# Patient Record
Sex: Male | Born: 1995 | Race: White | Hispanic: No | Marital: Single | Smoking: Current every day smoker
Health system: Southern US, Community
[De-identification: ages and names within clinical notes are randomized; demographics above are authoritative.]

## PROBLEM LIST (undated history)

## (undated) DIAGNOSIS — K5790 Diverticulosis of intestine, part unspecified, without perforation or abscess without bleeding: Secondary | ICD-10-CM

## (undated) HISTORY — PX: APPENDECTOMY: SHX54

---
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
## 2013-01-05 ENCOUNTER — Emergency Department (HOSPITAL_COMMUNITY): Payer: BC Managed Care – PPO | Admitting: Anesthesiology

## 2013-01-05 ENCOUNTER — Encounter (HOSPITAL_COMMUNITY): Admission: EM | Disposition: A | Discharge status: Home / Self Care | Payer: Self-pay | Source: Home / Self Care

## 2013-01-05 ENCOUNTER — Encounter (HOSPITAL_COMMUNITY): Payer: Self-pay

## 2013-01-05 ENCOUNTER — Emergency Department (HOSPITAL_COMMUNITY): Payer: BC Managed Care – PPO

## 2013-01-05 ENCOUNTER — Observation Stay (HOSPITAL_COMMUNITY)
Admission: EM | Admit: 2013-01-05 | Discharge: 2013-01-06 | Disposition: A | Discharge status: Home / Self Care | Payer: BC Managed Care – PPO | Attending: General Surgery | Admitting: General Surgery

## 2013-01-05 ENCOUNTER — Encounter (HOSPITAL_COMMUNITY): Payer: Self-pay | Admitting: Anesthesiology

## 2013-01-05 DIAGNOSIS — Z01812 Encounter for preprocedural laboratory examination: Secondary | ICD-10-CM | POA: Insufficient documentation

## 2013-01-05 DIAGNOSIS — K3580 Unspecified acute appendicitis: Principal | ICD-10-CM | POA: Insufficient documentation

## 2013-01-05 HISTORY — PX: LAPAROSCOPIC APPENDECTOMY: SHX408

## 2013-01-05 LAB — BASIC METABOLIC PANEL
BUN: 14 mg/dL (ref 6–23)
Chloride: 104 mEq/L (ref 96–112)
GFR calc Af Amer: >90 mL/min (ref >90)
Glucose, Bld: 94 mg/dL (ref 70–99)
Potassium: 3.8 mEq/L (ref 3.5–5.1)

## 2013-01-05 LAB — URINALYSIS, ROUTINE W REFLEX MICROSCOPIC
Bilirubin Urine: NEGATIVE
Nitrite: NEGATIVE
Specific Gravity, Urine: 1.025 (ref 1.005–1.030)
pH: 6.5 (ref 5.0–8.0)

## 2013-01-05 LAB — CBC WITH DIFFERENTIAL/PLATELET
Basophils Relative: 0 % (ref 0–1)
Hemoglobin: 14.9 g/dL (ref 13.0–17.0)
Lymphs Abs: 2.7 10*3/uL (ref 0.7–4.0)
Monocytes Relative: 9 % (ref 3–12)
Neutro Abs: 6 10*3/uL (ref 1.7–7.7)
Neutrophils Relative %: 61 % (ref 43–77)
RBC: 5.25 MIL/uL (ref 4.22–5.81)

## 2013-01-05 SURGERY — APPENDECTOMY, LAPAROSCOPIC
Anesthesia: General | Site: Abdomen | Wound class: Contaminated

## 2013-01-05 MED ORDER — HYDROCODONE-ACETAMINOPHEN 5-325 MG PO TABS
ORAL_TABLET | ORAL | Status: AC
  Filled 2013-01-05: qty 1

## 2013-01-05 MED ORDER — IOHEXOL 300 MG/ML  SOLN
100.0000 mL | Freq: Once | INTRAMUSCULAR | Status: AC | PRN
  Administered 2013-01-05: 100 mL via INTRAVENOUS

## 2013-01-05 MED ORDER — FENTANYL CITRATE 0.05 MG/ML IJ SOLN
50.0000 ug | Freq: Once | INTRAMUSCULAR | Status: AC
  Administered 2013-01-05: 50 ug via INTRAVENOUS
  Filled 2013-01-05: qty 2

## 2013-01-05 MED ORDER — HYDROCODONE-ACETAMINOPHEN 5-325 MG PO TABS
1.0000 | ORAL_TABLET | Freq: Once | ORAL | Status: AC
  Administered 2013-01-05: 1 via ORAL

## 2013-01-05 MED ORDER — FENTANYL CITRATE 0.05 MG/ML IJ SOLN
50.0000 ug | Freq: Once | INTRAMUSCULAR | Status: AC
  Administered 2013-01-05: 50 ug via INTRAVENOUS

## 2013-01-05 MED ORDER — SODIUM CHLORIDE 0.9 % IV SOLN
INTRAVENOUS | Status: DC
  Administered 2013-01-05 (×2): via INTRAVENOUS

## 2013-01-05 MED ORDER — ROCURONIUM BROMIDE 100 MG/10ML IV SOLN
INTRAVENOUS | Status: DC | PRN
  Administered 2013-01-05: 25 mg via INTRAVENOUS

## 2013-01-05 MED ORDER — HYDROMORPHONE HCL PF 1 MG/ML IJ SOLN: 1.0000 mg | Freq: Once | INTRAMUSCULAR | Status: DC

## 2013-01-05 MED ORDER — DEXTROSE 5 % IV SOLN
2.0000 g | Freq: Two times a day (BID) | INTRAVENOUS | Status: DC
  Filled 2013-01-05 (×3): qty 2

## 2013-01-05 MED ORDER — SUCCINYLCHOLINE CHLORIDE 20 MG/ML IJ SOLN
INTRAMUSCULAR | Status: DC | PRN
  Administered 2013-01-05: 120 mg via INTRAVENOUS

## 2013-01-05 MED ORDER — ENOXAPARIN SODIUM 40 MG/0.4ML ~~LOC~~ SOLN: 40.0000 mg | Freq: Once | SUBCUTANEOUS | Status: DC

## 2013-01-05 MED ORDER — DIPHENHYDRAMINE HCL 50 MG/ML IJ SOLN
25.0000 mg | Freq: Once | INTRAMUSCULAR | Status: AC
  Administered 2013-01-05: 25 mg via INTRAVENOUS

## 2013-01-05 MED ORDER — LIDOCAINE HCL (CARDIAC) 20 MG/ML IV SOLN
INTRAVENOUS | Status: DC | PRN
  Administered 2013-01-05: 50 mg via INTRAVENOUS

## 2013-01-05 MED ORDER — HYDROCODONE-ACETAMINOPHEN 5-325 MG PO TABS
1.0000 | ORAL_TABLET | ORAL | Status: DC | PRN
  Administered 2013-01-05: 1 via ORAL
  Administered 2013-01-05: 2 via ORAL
  Administered 2013-01-06 (×2): 1 via ORAL
  Filled 2013-01-05 (×2): qty 1
  Filled 2013-01-05: qty 2

## 2013-01-05 MED ORDER — HYDROCODONE-ACETAMINOPHEN 5-325 MG PO TABS: 1.0000 | ORAL_TABLET | ORAL | Status: DC | PRN

## 2013-01-05 MED ORDER — MIDAZOLAM HCL 5 MG/5ML IJ SOLN
INTRAMUSCULAR | Status: DC | PRN
  Administered 2013-01-05: 2 mg via INTRAVENOUS

## 2013-01-05 MED ORDER — PROPOFOL 10 MG/ML IV BOLUS
INTRAVENOUS | Status: DC | PRN
  Administered 2013-01-05: 150 mg via INTRAVENOUS

## 2013-01-05 MED ORDER — BUPIVACAINE HCL 0.5 % IJ SOLN
INTRAMUSCULAR | Status: DC | PRN
  Administered 2013-01-05: 10 mL

## 2013-01-05 MED ORDER — GLYCOPYRROLATE 0.2 MG/ML IJ SOLN
INTRAMUSCULAR | Status: DC | PRN
  Administered 2013-01-05: 0.6 mg via INTRAVENOUS

## 2013-01-05 MED ORDER — NEOSTIGMINE METHYLSULFATE 1 MG/ML IJ SOLN
INTRAMUSCULAR | Status: DC | PRN
  Administered 2013-01-05: 4 mg via INTRAVENOUS

## 2013-01-05 MED ORDER — LACTATED RINGERS IV SOLN
INTRAVENOUS | Status: DC | PRN
  Administered 2013-01-05: 13:00:00 via INTRAVENOUS

## 2013-01-05 MED ORDER — ONDANSETRON HCL 4 MG/2ML IJ SOLN
INTRAMUSCULAR | Status: DC | PRN
  Administered 2013-01-05: 4 mg via INTRAVENOUS

## 2013-01-05 MED ORDER — FENTANYL CITRATE 0.05 MG/ML IJ SOLN
INTRAMUSCULAR | Status: DC | PRN
  Administered 2013-01-05: 100 ug via INTRAVENOUS
  Administered 2013-01-05: 50 ug via INTRAVENOUS
  Administered 2013-01-05: 100 ug via INTRAVENOUS

## 2013-01-05 MED ORDER — SODIUM CHLORIDE 0.9 % IV SOLN
1.0000 g | Freq: Once | INTRAVENOUS | Status: AC
  Administered 2013-01-05: 1 g via INTRAVENOUS
  Filled 2013-01-05: qty 1

## 2013-01-05 MED ORDER — FENTANYL CITRATE 0.05 MG/ML IJ SOLN
INTRAMUSCULAR | Status: AC
  Administered 2013-01-05: 50 ug via INTRAVENOUS
  Filled 2013-01-05: qty 2

## 2013-01-05 MED ORDER — HYDROMORPHONE HCL PF 1 MG/ML IJ SOLN
INTRAMUSCULAR | Status: AC
  Filled 2013-01-05: qty 1

## 2013-01-05 MED ORDER — IOHEXOL 300 MG/ML  SOLN
50.0000 mL | Freq: Once | INTRAMUSCULAR | Status: AC | PRN
  Administered 2013-01-05: 50 mL via ORAL

## 2013-01-05 MED ORDER — CELECOXIB 100 MG PO CAPS
400.0000 mg | ORAL_CAPSULE | Freq: Every day | ORAL | Status: AC
  Administered 2013-01-05: 400 mg via ORAL

## 2013-01-05 MED ORDER — SODIUM CHLORIDE 0.9 % IR SOLN
Status: DC | PRN
  Administered 2013-01-05: 1000 mL

## 2013-01-05 SURGICAL SUPPLY — 42 items
BAG HAMPER (MISCELLANEOUS) ×2 IMPLANT
BENZOIN TINCTURE PRP APPL 2/3 (GAUZE/BANDAGES/DRESSINGS) ×2 IMPLANT
CLOTH BEACON ORANGE TIMEOUT ST (SAFETY) ×2 IMPLANT
COVER LIGHT HANDLE STERIS (MISCELLANEOUS) ×4 IMPLANT
CUTTER FLEX LINEAR 45M (STAPLE) ×2 IMPLANT
DECANTER SPIKE VIAL GLASS SM (MISCELLANEOUS) ×2 IMPLANT
DEVICE TROCAR PUNCTURE CLOSURE (ENDOMECHANICALS) ×2 IMPLANT
DURAPREP 26ML APPLICATOR (WOUND CARE) ×2 IMPLANT
ELECT REM PT RETURN 9FT ADLT (ELECTROSURGICAL) ×2
ELECTRODE REM PT RTRN 9FT ADLT (ELECTROSURGICAL) ×1 IMPLANT
FILTER SMOKE EVAC LAPAROSHD (FILTER) ×2 IMPLANT
FORMALIN 10 PREFIL 120ML (MISCELLANEOUS) ×2 IMPLANT
GLOVE BIOGEL PI IND STRL 7.5 (GLOVE) ×1 IMPLANT
GLOVE BIOGEL PI INDICATOR 7.5 (GLOVE) ×1
GLOVE ECLIPSE 7.0 STRL STRAW (GLOVE) ×4 IMPLANT
GLOVE EXAM NITRILE MD LF STRL (GLOVE) ×2 IMPLANT
GLOVE INDICATOR 7.5 STRL GRN (GLOVE) ×2 IMPLANT
GOWN STRL REIN XL XLG (GOWN DISPOSABLE) ×4 IMPLANT
INST SET LAPROSCOPIC AP (KITS) ×2 IMPLANT
KIT ROOM TURNOVER APOR (KITS) ×2 IMPLANT
MANIFOLD NEPTUNE II (INSTRUMENTS) ×2 IMPLANT
NEEDLE INSUFFLATION 14GA 120MM (NEEDLE) ×2 IMPLANT
NS IRRIG 1000ML POUR BTL (IV SOLUTION) ×2 IMPLANT
PACK LAP CHOLE LZT030E (CUSTOM PROCEDURE TRAY) ×2 IMPLANT
PAD ARMBOARD 7.5X6 YLW CONV (MISCELLANEOUS) ×2 IMPLANT
POUCH SPECIMEN RETRIEVAL 10MM (ENDOMECHANICALS) ×2 IMPLANT
RELOAD 45 VASCULAR/THIN (ENDOMECHANICALS) IMPLANT
RELOAD STAPLE TA45 3.5 REG BLU (ENDOMECHANICALS) ×2 IMPLANT
SEALER TISSUE G2 CVD JAW 35 (ENDOMECHANICALS) ×1 IMPLANT
SEALER TISSUE G2 CVD JAW 45CM (ENDOMECHANICALS) ×1
SET BASIN LINEN APH (SET/KITS/TRAYS/PACK) ×2 IMPLANT
SET TUBE IRRIG SUCTION NO TIP (IRRIGATION / IRRIGATOR) IMPLANT
SLEEVE Z-THREAD 5X100MM (TROCAR) IMPLANT
STRIP CLOSURE SKIN 1/2X4 (GAUZE/BANDAGES/DRESSINGS) ×2 IMPLANT
SUT MNCRL AB 4-0 PS2 18 (SUTURE) ×2 IMPLANT
SUT VIC AB 2-0 CT2 27 (SUTURE) ×4 IMPLANT
TRAY FOLEY CATH 16FR SILVER (SET/KITS/TRAYS/PACK) ×2 IMPLANT
TROCAR Z-THAD FIOS HNDL 12X100 (TROCAR) ×2 IMPLANT
TROCAR Z-THRD FIOS HNDL 11X100 (TROCAR) ×2 IMPLANT
TROCAR Z-THREAD FIOS 5X100MM (TROCAR) ×2 IMPLANT
TUBING INSUFFLATION (TUBING) ×2 IMPLANT
WARMER LAPAROSCOPE (MISCELLANEOUS) ×2 IMPLANT

## 2013-01-05 NOTE — H&P (Signed)
Bryan Dominguez is an 17 y.o. male.   Chief Complaint: Right lower quadrant abdominal pain HPI: Patient presented to [facility] with less than 24 hours of right lower quadrant abdominal pain. Pain initially started in the periumbilical region yesterday and his localized to the right lower quadrant. There is no significant radiation beyond this. It is constant and sharp. It is exacerbated with movement and palpation. His appetite has decreased. He has had associated nausea but no emesis. Has had some diarrhea but mostly after the contrast from his CT. He has had subjective fevers and chills. No similar symptomatology in the past. No unusual travel or unusual exposures.  History reviewed. No pertinent past medical history.  History reviewed. No pertinent past surgical history.  No family history on file. Social History:  reports that he has never smoked. He does not have any smokeless tobacco history on file. He reports that he does not drink alcohol or use illicit drugs.  Allergies: No Known Allergies   (Not in a hospital admission)  Results for orders placed during the hospital encounter of 01/05/13 (from the past 48 hour(s))  CBC WITH DIFFERENTIAL     Status: None   Collection Time    01/05/13  2:20 AM      Result Value Range   WBC 9.7  4.0 - 10.5 K/uL   RBC 5.25  4.22 - 5.81 MIL/uL   Hemoglobin 14.9  13.0 - 17.0 g/dL   HCT 91.4  78.2 - 95.6 %   MCV 84.0  78.0 - 100.0 fL   MCH 28.4  26.0 - 34.0 pg   MCHC 33.8  30.0 - 36.0 g/dL   RDW 21.3  08.6 - 57.8 %   Platelets 219  150 - 400 K/uL   Neutrophils Relative % 61  43 - 77 %   Neutro Abs 6.0  1.7 - 7.7 K/uL   Lymphocytes Relative 28  12 - 46 %   Lymphs Abs 2.7  0.7 - 4.0 K/uL   Monocytes Relative 9  3 - 12 %   Monocytes Absolute 0.9  0.1 - 1.0 K/uL   Eosinophils Relative 2  0 - 5 %   Eosinophils Absolute 0.2  0.0 - 0.7 K/uL   Basophils Relative 0  0 - 1 %   Basophils Absolute 0.0  0.0 - 0.1 K/uL  BASIC METABOLIC PANEL      Status: None   Collection Time    01/05/13  2:20 AM      Result Value Range   Sodium 144  135 - 145 mEq/L   Potassium 3.8  3.5 - 5.1 mEq/L   Chloride 104  96 - 112 mEq/L   CO2 30  19 - 32 mEq/L   Glucose, Bld 94  70 - 99 mg/dL   BUN 14  6 - 23 mg/dL   Creatinine, Ser 4.69  0.50 - 1.35 mg/dL   Calcium 9.8  8.4 - 62.9 mg/dL   GFR calc non Af Amer >90  >90 mL/min   GFR calc Af Amer >90  >90 mL/min   Comment: (NOTE)     The eGFR has been calculated using the CKD EPI equation.     This calculation has not been validated in all clinical situations.     eGFR's persistently <90 mL/min signify possible Chronic Kidney     Disease.  URINALYSIS, ROUTINE W REFLEX MICROSCOPIC     Status: None   Collection Time    01/05/13  2:29 AM      Result Value Range   Color, Urine YELLOW  YELLOW   APPearance CLEAR  CLEAR   Specific Gravity, Urine 1.025  1.005 - 1.030   pH 6.5  5.0 - 8.0   Glucose, UA NEGATIVE  NEGATIVE mg/dL   Hgb urine dipstick NEGATIVE  NEGATIVE   Bilirubin Urine NEGATIVE  NEGATIVE   Ketones, ur NEGATIVE  NEGATIVE mg/dL   Protein, ur NEGATIVE  NEGATIVE mg/dL   Urobilinogen, UA 0.2  0.0 - 1.0 mg/dL   Nitrite NEGATIVE  NEGATIVE   Leukocytes, UA NEGATIVE  NEGATIVE   Comment: MICROSCOPIC NOT DONE ON URINES WITH NEGATIVE PROTEIN, BLOOD, LEUKOCYTES, NITRITE, OR GLUCOSE <1000 mg/dL.   Ct Abdomen Pelvis W Contrast  01/05/2013   *RADIOLOGY REPORT*  Clinical Data: Right lower quadrant pain for 1 day.  CT ABDOMEN AND PELVIS WITH CONTRAST  Technique:  Multidetector CT imaging of the abdomen and pelvis was performed following the standard protocol during bolus administration of intravenous contrast.  Contrast: 50mL OMNIPAQUE IOHEXOL 300 MG/ML  SOLN, OMNIPAQUE IOHEXOL 300 MG/ML  SOLN  Comparison: None.  Findings: The lung bases are clear.  The liver, spleen, gallbladder, pancreas, kidneys, abdominal aorta, inferior vena cava, and retroperitoneal lymph nodes are unremarkable.  There are  calcifications in the adrenal glands consistent with old infection or old hemorrhage.  Left adrenal gland adenoma.  The stomach and small bowel are not abnormally distended.  Contrast material flows to the rectum without evidence of bowel obstruction.  No colonic dilatation. Small umbilical hernia containing fat.  The no free air or free fluid in the abdomen.  Pelvis:  The appendix is distended at 14 mm diameter.  There is mild periappendiceal infiltration.  Changes are consistent with early acute appendicitis.  No abscess.  Normal sized prostate gland.  No bladder wall thickening.  No free or loculated pelvic fluid collections.  Right lower quadrant and pelvic lymph nodes are prominent without pathologic enlargement, likely reactive.  Normal alignment of the lumbar spine.  IMPRESSION: Distended appendix with minimal periappendiceal infiltration suggesting early acute appendicitis.  No abscess.   Original Report Authenticated By: Burman Nieves, M.D.    Review of Systems  Constitutional: Positive for fever and chills. Negative for weight loss.  HENT: Negative.   Eyes: Negative.   Respiratory: Negative.   Cardiovascular: Negative.   Gastrointestinal: Positive for nausea, abdominal pain (right lower quadrant) and diarrhea. Negative for heartburn, vomiting, constipation, blood in stool and melena.  Genitourinary: Negative.   Musculoskeletal: Negative.   Skin: Negative.   Neurological: Negative.   Endo/Heme/Allergies: Negative.   Psychiatric/Behavioral: Negative.     Blood pressure 128/72, pulse 64, temperature 98.5 F (36.9 C), temperature source Oral, resp. rate 18, height 5\' 7"  (1.702 m), weight 70.308 kg (155 lb), SpO2 98.00%. Physical Exam  Constitutional: He is oriented to person, place, and time. He appears well-developed and well-nourished. No distress.  HENT:  Head: Normocephalic and atraumatic.  Eyes: Conjunctivae and EOM are normal. Pupils are equal, round, and reactive to light.   Neck: Normal range of motion. Neck supple. No tracheal deviation present. No thyromegaly present.  Cardiovascular: Normal rate and regular rhythm.   Respiratory: Effort normal and breath sounds normal. No respiratory distress.  GI: Soft. He exhibits no distension and no mass. There is tenderness (Right lower quadrant). There is rebound and guarding.  Lymphadenopathy:    He has no cervical adenopathy.  Neurological: He is alert and oriented to person, place,  and time.  Skin: Skin is warm and dry.     Assessment/Plan Acute appendicitis. Risks benefits alternatives a laparoscopic possible open appendectomy are discussed at length with the patient and patient's family. Risk including but not limited to risk of bleeding, infection, appendiceal stump leak, intraoperative cardiac and pulmonary ventral discussed with the patient. At this point patient will be continued in an n.p.o. status. Continued on DVT prophylaxis. Patient has been given Invanz for antibiotic coverage this morning. We'll plan to proceed to the operating room.  Tejal Monroy C 01/05/2013, 11:25 AM

## 2013-01-05 NOTE — ED Notes (Signed)
Pt ambulatory to the bathroom , pt needs to have BM

## 2013-01-05 NOTE — ED Notes (Signed)
Pt c/o abd pain, rates at 8.  Called Dr. Caesar Bookman.  Dr. Caesar Bookman was under the impression pt was already admitted upstairs.  Reports is on his way to hospital.  Ok to given another of fentanyl.  Notified family of delay and told them he was on his way.  Family verbalized understanding.

## 2013-01-05 NOTE — Op Note (Signed)
Patient:  Bryan Dominguez  DOB:  1996/05/03  MRN:  [id number]   Preop Diagnosis:  Acute appendicitis  Postop Diagnosis:  The same  Procedure:  Laparoscopic appendectomy  Surgeon:  Dr. Tilford Pillar  Anes:  General endotracheal, 0.5% Sensorcaine plain for local  Indications:  Patient is an 17 year old male presented to [facility] hospital right lower quadrant abdominal pain. Risks benefits alternatives a laparoscopic possible open appendectomy are discussed at length patient and family. Risk including but not limited to risk of bleeding, infection, appendiceal stump leak, intraoperative cardiac and pulmonary ventral discussed with the patient. Patient's questions and concerns are addressed the patient was consented for the planned procedure.  Procedure note:  Patient is taken to the operating room was placed in supine position the or table time the general anesthetic is administered. Once patient was asleep eccentrically needed by the nurse anesthetist. Foley catheter is placed in standard sterile fashion by the operative staff.   At this point his abdomen is prepped with DuraPrep solution and draped in standard fashion. Time out was performed. A stab incision was created supraumbilically with 11 blade scalpel additional dissection down to subcutaneous tissue carried out using a Coker clamp was utilized grasp the anterior abdominal fascia and lift this anteriorly. A Veress needle is inserted saline drop is utilized confirm intraperitoneal placement. At this time pneumoperitoneum was initiated once sufficient pneumoperitoneum was obtained a 12 mm insert overlap scope allowing visualization the trocar entering into the peritoneal cavity. This point the inner cannulas removed lap strips reinserted there is no evidence a trocar peritoneal placement injury. At this time the remaining traverse replaced a 5 mm in the suprapubic region and a 11 mm in the left lateral bowel wall. Patient's placed into a left  lateral decubitus position. The tinea followed down the cecum to the base the appendix. The appendix is clearly dilated and hyperemic. A window was created between the appendix and mesoappendix with a Art gallery manager. The mesoappendix is then ligated using the Enseal bipolar device. The base the appendix is divided using a 45 Endo GIA standard stapler load. Once appendix is free is placed into an Endo Catch bag and placed into the right upper quadrant. Inspection the appendiceal stump as well as the mesoappendix indicate excellent hemostasis. At this time attention was turned to closure  A Endo Close suture passing device is utilized to pass a 2-0 Vicryl suture through both the 11 and 12 mm after with the sutures in place the appendix is retrieved was read the umbilical trocar site and intact Endo Catch bag. It is placed in the back table sent as a permanent specimen to pathology. At this time the pneumoperitoneum was evacuated. Trochars were removed. The Vicryl sutures were secured. Local anesthetic is instilled. A 4 Monocryl utilized reapproximate the skin edges at all 4 trocar sites. The skin was washed dried moist dry towel. Benzoin is applied around incision. Half-inch are suture placed. The drapes removed patient left come general anesthetic. Transferred back in stable condition. At the conclusion of procedure all Eschmann, sponge, needle counts are correct. Patient tolerated procedure extremely well.  Complications:  None  EBL:  Minimal  Specimen:  Appendix

## 2013-01-05 NOTE — Anesthesia Procedure Notes (Signed)
Procedure Name: Intubation Date/Time: 01/05/2013 12:11 PM Performed by: Carolyne Littles, Iolanda Folson L Pre-anesthesia Checklist: Patient identified, Patient being monitored, Timeout performed, Emergency Drugs available and Suction available Patient Re-evaluated:Patient Re-evaluated prior to inductionOxygen Delivery Method: Circle System Utilized Preoxygenation: Pre-oxygenation with 100% oxygen Intubation Type: IV induction, Rapid sequence and Cricoid Pressure applied Laryngoscope Size: 3 and Miller Grade View: Grade I Tube type: Oral Tube size: 7.0 mm Number of attempts: 1 Airway Equipment and Method: stylet Placement Confirmation: ETT inserted through vocal cords under direct vision,  positive ETCO2 and breath sounds checked- equal and bilateral Secured at: 21 cm Tube secured with: Tape Dental Injury: Teeth and Oropharynx as per pre-operative assessment

## 2013-01-05 NOTE — ED Notes (Signed)
Pt does heavy work, denies vomiting or diarrhea

## 2013-01-05 NOTE — Progress Notes (Signed)
Dr. Leticia Penna notified of pt's increasing pain.  New orders received.  Pt to be admitted for 24 hr obs.  Prescription given to pt's mother, with discharge instructions.  Pt's mother verbalized understanding of instructions.  Pt's mother and father at bedside.  Will cont to monitor.

## 2013-01-05 NOTE — ED Notes (Signed)
OR staff transporting pt to OR

## 2013-01-05 NOTE — Preoperative (Signed)
Beta Blockers   Reason not to administer Beta Blockers:Not Applicable 

## 2013-01-05 NOTE — ED Notes (Signed)
Pain in right lower abd that started early Saturday am, denies n/v/d

## 2013-01-05 NOTE — ED Notes (Signed)
Report given to [employer], OR RN.  OR nurses in room with pt.  Preparing to take him to OR. SCD's on bed.

## 2013-01-05 NOTE — Anesthesia Postprocedure Evaluation (Signed)
  Anesthesia Post-op Note  Patient: Bryan Dominguez  Procedure(s) Performed: Procedure(s): APPENDECTOMY LAPAROSCOPIC (N/A)  Patient Location: PACU  Anesthesia Type:General  Level of Consciousness: awake, oriented and patient cooperative  Airway and Oxygen Therapy: Patient Spontanous Breathing  Post-op Pain: none  Post-op Assessment: Post-op Vital signs reviewed, Patient's Cardiovascular Status Stable, Respiratory Function Stable, Patent Airway, No signs of Nausea or vomiting and Pain level controlled  Post-op Vital Signs: Reviewed and stable  Complications: No apparent anesthesia complications

## 2013-01-05 NOTE — Addendum Note (Signed)
Addendum created 01/05/13 1336 by Marolyn Hammock, CRNA   Modules edited: Anesthesia Medication Administration

## 2013-01-05 NOTE — Anesthesia Preprocedure Evaluation (Signed)
Anesthesia Evaluation  Patient identified by MRN, date of birth, ID band Patient awake and Patient confused    Reviewed: Allergy & Precautions, H&P , NPO status , Patient's Chart, lab work & pertinent test results, reviewed documented beta blocker date and time   Airway Mallampati: II TM Distance: >3 FB Neck ROM: full    Dental no notable dental hx. (+) Teeth Intact   Pulmonary  breath sounds clear to auscultation        Cardiovascular     Neuro/Psych    GI/Hepatic Patient received Oral Contrast Agents,Oral Contrast 0300   Endo/Other    Renal/GU      Musculoskeletal   Abdominal   Peds  Hematology   Anesthesia Other Findings   Reproductive/Obstetrics                           Anesthesia Physical Anesthesia Plan  ASA: I and emergent  Anesthesia Plan: General ETT, Rapid Sequence and Cricoid Pressure   Post-op Pain Management:    Induction:   Airway Management Planned:   Additional Equipment:   Intra-op Plan:   Post-operative Plan:   Informed Consent: I have reviewed the patients History and Physical, chart, labs and discussed the procedure including the risks, benefits and alternatives for the proposed anesthesia with the patient or authorized representative who has indicated his/her understanding and acceptance.   Dental Advisory Given  Plan Discussed with: Anesthesiologist and Surgeon  Anesthesia Plan Comments:         Anesthesia Quick Evaluation

## 2013-01-05 NOTE — Transfer of Care (Signed)
Immediate Anesthesia Transfer of Care Note  Patient: Bryan Dominguez  Procedure(s) Performed: Procedure(s): APPENDECTOMY LAPAROSCOPIC (N/A)  Patient Location: PACU  Anesthesia Type:General  Level of Consciousness: awake, oriented and patient cooperative  Airway & Oxygen Therapy: Patient Spontanous Breathing  Post-op Assessment: Report given to PACU RN and Post -op Vital signs reviewed and stable  Post vital signs: Reviewed and stable  Complications: No apparent anesthesia complications

## 2013-01-05 NOTE — ED Provider Notes (Signed)
CSN: [id number]     Arrival date & time 01/05/13  0138 History     First MD Initiated Contact with Patient 01/05/13 0200     Chief Complaint  Patient presents with  . Abdominal Pain   (Consider location/radiation/quality/duration/timing/severity/associated sxs/prior Treatment) HPI This is an 17 year old male with right lower quadrant pain that came on suddenly yesterday morning about 8 AM. It is worse with movement or palpation. It is moderate in severity at the present time. He denies fever, chills, nausea, vomiting, diarrhea, anorexia, dysuria or hematuria. The pain does not radiate. He describes the pain as dull.  History reviewed. No pertinent past medical history. History reviewed. No pertinent past surgical history. No family history on file. History  Substance Use Topics  . Smoking status: Never Smoker   . Smokeless tobacco: Not on file  . Alcohol Use: No    Review of Systems  All other systems reviewed and are negative.    Allergies  Review of patient's allergies indicates no known allergies.  Home Medications  No current outpatient prescriptions on file. BP 156/90  Pulse 74  Temp(Src) 98.2 F (36.8 C) (Oral)  Ht 5\' 7"  (1.702 m)  Wt 155 lb (70.308 kg)  BMI 24.27 kg/m2  SpO2 100%  Physical Exam General: Well-developed, well-nourished male in no acute distress; appearance consistent with age of record HENT: normocephalic; atraumatic Eyes: pupils equal, round and reactive to light; extraocular muscles intact Neck: supple Heart: regular rate and rhythm; no murmurs, rubs or gallops Lungs: clear to auscultation bilaterally Abdomen: soft; nondistended; right lower quadrant tenderness; no masses or hepatosplenomegaly; bowel sounds present Extremities: No deformity; full range of motion; pulses normal Neurologic: Awake, alert and oriented; motor function intact in all extremities and symmetric; no facial droop Skin: Warm and dry Psychiatric: Normal mood and  affect    ED Course   Procedures (including critical care time)   MDM   Nursing notes and vitals signs, including pulse oximetry, reviewed.  Summary of this visit's results, reviewed by myself:  Labs:  Results for orders placed during the hospital encounter of 01/05/13 (from the past 24 hour(s))  CBC WITH DIFFERENTIAL     Status: None   Collection Time    01/05/13  2:20 AM      Result Value Range   WBC 9.7  4.0 - 10.5 K/uL   RBC 5.25  4.22 - 5.81 MIL/uL   Hemoglobin 14.9  13.0 - 17.0 g/dL   HCT 52.8  41.3 - 24.4 %   MCV 84.0  78.0 - 100.0 fL   MCH 28.4  26.0 - 34.0 pg   MCHC 33.8  30.0 - 36.0 g/dL   RDW 01.0  27.2 - 53.6 %   Platelets 219  150 - 400 K/uL   Neutrophils Relative % 61  43 - 77 %   Neutro Abs 6.0  1.7 - 7.7 K/uL   Lymphocytes Relative 28  12 - 46 %   Lymphs Abs 2.7  0.7 - 4.0 K/uL   Monocytes Relative 9  3 - 12 %   Monocytes Absolute 0.9  0.1 - 1.0 K/uL   Eosinophils Relative 2  0 - 5 %   Eosinophils Absolute 0.2  0.0 - 0.7 K/uL   Basophils Relative 0  0 - 1 %   Basophils Absolute 0.0  0.0 - 0.1 K/uL  BASIC METABOLIC PANEL     Status: None   Collection Time    01/05/13  2:20 AM  Result Value Range   Sodium 144  135 - 145 mEq/L   Potassium 3.8  3.5 - 5.1 mEq/L   Chloride 104  96 - 112 mEq/L   CO2 30  19 - 32 mEq/L   Glucose, Bld 94  70 - 99 mg/dL   BUN 14  6 - 23 mg/dL   Creatinine, Ser 4.78  0.50 - 1.35 mg/dL   Calcium 9.8  8.4 - 29.5 mg/dL   GFR calc non Af Amer >90  >90 mL/min   GFR calc Af Amer >90  >90 mL/min  URINALYSIS, ROUTINE W REFLEX MICROSCOPIC     Status: None   Collection Time    01/05/13  2:29 AM      Result Value Range   Color, Urine YELLOW  YELLOW   APPearance CLEAR  CLEAR   Specific Gravity, Urine 1.025  1.005 - 1.030   pH 6.5  5.0 - 8.0   Glucose, UA NEGATIVE  NEGATIVE mg/dL   Hgb urine dipstick NEGATIVE  NEGATIVE   Bilirubin Urine NEGATIVE  NEGATIVE   Ketones, ur NEGATIVE  NEGATIVE mg/dL   Protein, ur NEGATIVE   NEGATIVE mg/dL   Urobilinogen, UA 0.2  0.0 - 1.0 mg/dL   Nitrite NEGATIVE  NEGATIVE   Leukocytes, UA NEGATIVE  NEGATIVE    Imaging Studies: Ct Abdomen Pelvis W Contrast  01/05/2013   *RADIOLOGY REPORT*  Clinical Data: Right lower quadrant pain for 1 day.  CT ABDOMEN AND PELVIS WITH CONTRAST  Technique:  Multidetector CT imaging of the abdomen and pelvis was performed following the standard protocol during bolus administration of intravenous contrast.  Contrast: 50mL OMNIPAQUE IOHEXOL 300 MG/ML  SOLN, OMNIPAQUE IOHEXOL 300 MG/ML  SOLN  Comparison: None.  Findings: The lung bases are clear.  The liver, spleen, gallbladder, pancreas, kidneys, abdominal aorta, inferior vena cava, and retroperitoneal lymph nodes are unremarkable.  There are calcifications in the adrenal glands consistent with old infection or old hemorrhage.  Left adrenal gland adenoma.  The stomach and small bowel are not abnormally distended.  Contrast material flows to the rectum without evidence of bowel obstruction.  No colonic dilatation. Small umbilical hernia containing fat.  The no free air or free fluid in the abdomen.  Pelvis:  The appendix is distended at 14 mm diameter.  There is mild periappendiceal infiltration.  Changes are consistent with early acute appendicitis.  No abscess.  Normal sized prostate gland.  No bladder wall thickening.  No free or loculated pelvic fluid collections.  Right lower quadrant and pelvic lymph nodes are prominent without pathologic enlargement, likely reactive.  Normal alignment of the lumbar spine.  IMPRESSION: Distended appendix with minimal periappendiceal infiltration suggesting early acute appendicitis.  No abscess.   Original Report Authenticated By: Burman Nieves, M.D.      Hanley Seamen, MD 01/05/13 [phone]

## 2013-01-06 MED ORDER — HYDROCODONE-ACETAMINOPHEN 5-325 MG PO TABS: 1.0000 | ORAL_TABLET | ORAL | Status: DC | PRN

## 2013-01-06 NOTE — Progress Notes (Signed)
UR Chart Review Completed  

## 2013-01-06 NOTE — Discharge Summary (Signed)
Physician Discharge Summary  Patient ID: Bryan Dominguez MRN: [id number] DOB/AGE: Sep 16, 1995 16 y.o.  Admit date: 01/05/2013 Discharge date: 01/06/2013  Admission Diagnoses:  Acute appendicitis  Discharge Diagnoses:  The same Active Problems:   * No active hospital problems. *   Discharged Condition: stable  Hospital Course: Patient presented with less than 24 hours of right lower quadrant dominant pain. Workup and evaluation was consistent for acute appendicitis. Patient is taken to the operating room for planned laparoscopic appendectomy. He tolerated procedure well. He was observed over the subsequent 24 hours for pain control. This morning is tolerating a regular diet. His pain is controlled. Using bleeding. Plans are made for discharge.  Consults: None  Significant Diagnostic Studies: radiology: CT scan: Abdomen pelvis  Treatments: surgery: Laparoscopic appendectomy   Discharge Exam: Blood pressure 138/84, pulse 68, temperature 97.9 F (36.6 C), temperature source Oral, resp. rate 18, height 5\' 7"  (1.702 m), weight 70.308 kg (155 lb), SpO2 100.00%. General appearance: alert and no distress Resp: clear to auscultation bilaterally Cardio: regular rate and rhythm GI: Positive bowel sounds, soft, expected postoperative tenderness. Incisions are clean dry and intact. No peritoneal signs.  Disposition: Final discharge disposition not confirmed  Discharge Orders   Future Orders Complete By Expires   Call MD for:  persistant nausea and vomiting  As directed    Call MD for:  redness, tenderness, or signs of infection (pain, swelling, redness, odor or green/yellow discharge around incision site)  As directed    Call MD for:  severe uncontrolled pain  As directed    Call MD for:  temperature >100.4  As directed    Diet - low sodium heart healthy  As directed    Diet - low sodium heart healthy  As directed    Discharge instructions  As directed    Comments:     Increase activity  as tolerated. May place ice pack for comfort.  Alternate an anti-inflammatory such as ibuprofen (Motrin, Advil) 400-600mg  every 6 hours with the prescribed pain medication.   Do not take any additional acetaminophen as there is Tylenol in the pain medication.   Discharge instructions  As directed    Comments:     Increase activity as tolerated. May place ice pack for comfort.  Alternate an anti-inflammatory such as ibuprofen (Motrin, Advil) 400-600mg  every 6 hours with the prescribed pain medication.   Do not take any additional acetaminophen as there is Tylenol in the pain medication.   Discharge wound care:  As directed    Comments:     Clean surgical sites with soap and water.  May shower the morning after surgery unless instructed by Dr. Leticia Penna otherwise.  No soaking for 2-3 weeks.    If adhesive strips are in place, they may be removed in 1-2 weeks while in the shower.   Discharge wound care:  As directed    Comments:     Clean surgical sites with soap and water.  May shower the morning after surgery unless instructed by Dr. Leticia Penna otherwise.  No soaking for 2-3 weeks.    If adhesive strips are in place, they may be removed in 1-2 weeks while in the shower.\   Driving Restrictions  As directed    Comments:     No driving while on pain medications.   Driving Restrictions  As directed    Comments:     No driving while on pain medications.   Increase activity slowly  As directed  Increase activity slowly  As directed    Lifting restrictions  As directed    Comments:     No lifting over 20lbs for 4-5 weeks post-op.   Lifting restrictions  As directed    Comments:     No lifting over 20lbs for 4-5 weeks post-op.       Medication List         HYDROcodone-acetaminophen 5-325 MG per tablet  Commonly known as:  NORCO  Take 1-2 tablets by mouth every 4 (four) hours as needed for pain.     ibuprofen 200 MG tablet  Commonly known as:  ADVIL,MOTRIN  Take 800 mg by mouth  every 6 (six) hours as needed for pain.           Follow-up Information   Follow up with Lilliona Blakeney C, MD In 3 weeks.   Specialty:  General Surgery   Contact information:   [address] [place] [address] [phone]       Signed: Fabio Bering 01/06/2013, 10:39 AM

## 2013-01-06 NOTE — Progress Notes (Signed)
Pt discharged with instructions and prescriptions.  He and his mother verbalized understanding.  The patient left the floor ambulating with staff ins table condition.

## 2013-01-06 NOTE — Progress Notes (Signed)
Nutrition Brief Note  Patient identified on the Malnutrition Screening Tool (MST) Report, receiving a score of 4.  Wt Readings from Last 15 Encounters:  01/05/13 155 lb (70.308 kg) (55%*, Z = 0.14)  01/05/13 155 lb (70.308 kg) (55%*, Z = 0.14)  02/01/09 190 lb (86.183 kg) (98%*, Z = 2.08)   * Growth percentiles are based on CDC 2-20 Years data.   Pt admitted for acute appendicitis and s/p lap appendectomy. Wt hx reveals hx of distant wt loss. Noted discharge orders- pt to be discharged today.   Body mass index is 24.27 kg/(m^2). Patient meets criteria for normal weight based on current BMI.   Current diet order is heart healthy, patient is consuming approximately 50% of meals at this time. Labs and medications reviewed.   No nutrition interventions warranted at this time. If nutrition issues arise, please consult RD.   Tenea Sens A. Mayford Knife, RD, LDN Pager: [phone]

## 2013-01-07 ENCOUNTER — Encounter (HOSPITAL_COMMUNITY): Payer: Self-pay | Admitting: General Surgery

## 2015-01-04 ENCOUNTER — Encounter (HOSPITAL_COMMUNITY): Payer: Self-pay

## 2015-01-04 ENCOUNTER — Emergency Department (HOSPITAL_COMMUNITY)
Admission: EM | Admit: 2015-01-04 | Discharge: 2015-01-04 | Disposition: A | Discharge status: Home / Self Care | Payer: Self-pay | Attending: Emergency Medicine | Admitting: Emergency Medicine

## 2015-01-04 ENCOUNTER — Emergency Department (HOSPITAL_COMMUNITY): Payer: Self-pay

## 2015-01-04 DIAGNOSIS — N50811 Right testicular pain: Secondary | ICD-10-CM

## 2015-01-04 DIAGNOSIS — N508 Other specified disorders of male genital organs: Secondary | ICD-10-CM | POA: Insufficient documentation

## 2015-01-04 DIAGNOSIS — Z720 Tobacco use: Secondary | ICD-10-CM | POA: Insufficient documentation

## 2015-01-04 LAB — BASIC METABOLIC PANEL
Anion gap: 7 (ref 5–15)
BUN: 14 mg/dL (ref 6–20)
CO2: 30 mmol/L (ref 22–32)
Calcium: 9.2 mg/dL (ref 8.9–10.3)
Chloride: 102 mmol/L (ref 101–111)
Creatinine, Ser: 0.78 mg/dL (ref 0.61–1.24)
GFR calc Af Amer: >60 mL/min (ref >60)
GLUCOSE: 90 mg/dL (ref 65–99)
POTASSIUM: 4.2 mmol/L (ref 3.5–5.1)
Sodium: 139 mmol/L (ref 135–145)

## 2015-01-04 LAB — CBC WITH DIFFERENTIAL/PLATELET
BASOS ABS: 0 10*3/uL (ref 0.0–0.1)
Basophils Relative: 0 % (ref 0–1)
EOS PCT: 4 % (ref 0–5)
Eosinophils Absolute: 0.3 10*3/uL (ref 0.0–0.7)
HCT: 46.7 % (ref 39.0–52.0)
Hemoglobin: 16 g/dL (ref 13.0–17.0)
LYMPHS ABS: 2.2 10*3/uL (ref 0.7–4.0)
LYMPHS PCT: 27 % (ref 12–46)
MCH: 29.8 pg (ref 26.0–34.0)
MCHC: 34.3 g/dL (ref 30.0–36.0)
MCV: 87 fL (ref 78.0–100.0)
MONO ABS: 0.7 10*3/uL (ref 0.1–1.0)
Monocytes Relative: 9 % (ref 3–12)
Neutro Abs: 4.9 10*3/uL (ref 1.7–7.7)
Neutrophils Relative %: 60 % (ref 43–77)
PLATELETS: 191 10*3/uL (ref 150–400)
RBC: 5.37 MIL/uL (ref 4.22–5.81)
RDW: 13.4 % (ref 11.5–15.5)
WBC: 8.2 10*3/uL (ref 4.0–10.5)

## 2015-01-04 LAB — URINALYSIS, ROUTINE W REFLEX MICROSCOPIC
BILIRUBIN URINE: NEGATIVE
GLUCOSE, UA: NEGATIVE mg/dL
HGB URINE DIPSTICK: NEGATIVE
KETONES UR: NEGATIVE mg/dL
Leukocytes, UA: NEGATIVE
Nitrite: NEGATIVE
PH: 6.5 (ref 5.0–8.0)
Protein, ur: NEGATIVE mg/dL
Specific Gravity, Urine: 1.025 (ref 1.005–1.030)
Urobilinogen, UA: 0.2 mg/dL (ref 0.0–1.0)

## 2015-01-04 NOTE — Discharge Instructions (Signed)
Scrotal Swelling Scrotal swelling may occur on one or both sides of the scrotum. Pain may also occur with swelling. Possible causes of scrotal swelling include:   Injury.  Infection.  An ingrown hair or abrasion in the area.  Repeated rubbing from tight-fitting underwear.  Poor hygiene.  A weakened area in the muscles around the groin (hernia). A hernia can allow abdominal contents to push into the scrotum.  Fluid around the testicle (hydrocele).  Enlarged vein around the testicle (varicocele).  Certain medical treatments or existing conditions.  A recent genital surgery or procedure.  The spermatic cord becomes twisted in the scrotum, which cuts off blood supply (testicular torsion).  Testicular cancer. HOME CARE INSTRUCTIONS Once the cause of your scrotal swelling has been determined, you may be asked to monitor your scrotum for any changes. The following actions may help to alleviate any discomfort you are experiencing:  Rest and limit activity until the swelling goes away. Lying down is the preferred position.  Put ice on the scrotum:  Put ice in a plastic bag.  Place a towel between your skin and the bag.  Leave the ice on for 20 minutes, 2-3 times a day for 1-2 days.  Place a rolled towel under the testicles for support.  Wear loose-fitting clothing or an athletic support cup for comfort.  Take all medicines as directed by your health care provider.  Perform a monthly self-exam of the scrotum and penis. Feel for changes. Ask your health care provider how to perform a monthly self-exam if you are unsure. SEEK MEDICAL CARE IF:  You have a sudden (acute) onset of pain that is persistent and not improving.  You notice a heavy feeling or fluid in the scrotum.  You have pain or burning while urinating.  You have blood in the urine or semen.  You feel a lump around the testicle.  You notice that one testicle is larger than the other (slight variation is  normal).  You have a persistent dull ache or pain in the groin or scrotum. SEEK IMMEDIATE MEDICAL CARE IF:  The pain does not go away or becomes severe.  You have a fever or shaking chills.  You have pain or vomiting that cannot be controlled.  You notice significant redness or swelling of one or both sides of the scrotum.  You experience redness spreading upward from your scrotum to your abdomen or downward from your scrotum to your thighs. MAKE SURE YOU:  Understand these instructions.  Will watch your condition.  Will get help right away if you are not doing well or get worse. Document Released: 06/03/2010 Document Revised: 01/01/2013 Document Reviewed: 10/03/2012 Exit[employer] Patient Information 2015 [employer]. This information is not intended to replace advice given to you by your health care provider. Make sure you discuss any questions you have with your health care provider.  

## 2015-01-04 NOTE — ED Notes (Signed)
Pt is complaining of swelling in his right testicle. Denies other symptoms

## 2015-01-05 LAB — GC/CHLAMYDIA PROBE AMP (~~LOC~~) NOT AT ARMC
Chlamydia: NEGATIVE
Neisseria Gonorrhea: NEGATIVE

## 2015-01-06 NOTE — ED Provider Notes (Signed)
CSN: [id number]     Arrival date & time 01/04/15  4098 History   First MD Initiated Contact with Patient 01/04/15 [phone]     Chief Complaint  Patient presents with  . Testicle Pain     (Consider location/radiation/quality/duration/timing/severity/associated sxs/prior Treatment) HPI   Bryan Dominguez is a 19 y.o. male who presents to the Emergency Department complaining of swelling and "heaviness" to his right testicle.  He reports the symptoms have been present for several weeks.  Nothing makes the symptoms better or worse.  He denies injury, pain, penile tenderness or discharge, discoloration, fever or abdominal pain.     History reviewed. No pertinent past medical history. Past Surgical History  Procedure Laterality Date  . Laparoscopic appendectomy N/A 01/05/2013    Procedure: APPENDECTOMY LAPAROSCOPIC;  Surgeon: Fabio Bering, MD;  Location: [facility];  Service: General;  Laterality: N/A;   No family history on file. Social History  Substance Use Topics  . Smoking status: Current Every Day Smoker  . Smokeless tobacco: None  . Alcohol Use: Yes    Review of Systems  Constitutional: Negative for fever, chills and appetite change.  Respiratory: Negative for shortness of breath.   Cardiovascular: Negative for chest pain.  Gastrointestinal: Negative for nausea, vomiting, abdominal pain and blood in stool.  Genitourinary: Positive for scrotal swelling. Negative for dysuria, flank pain, decreased urine volume, discharge, penile swelling, difficulty urinating, genital sores and testicular pain.  Musculoskeletal: Negative for back pain.  Skin: Negative for color change and rash.  Neurological: Negative for dizziness, weakness and numbness.  Hematological: Negative for adenopathy.  All other systems reviewed and are negative.     Allergies  Review of patient's allergies indicates no known allergies.  Home Medications   Prior to Admission medications   Medication Sig Start Date  End Date Taking? Authorizing Provider  ibuprofen (ADVIL,MOTRIN) 200 MG tablet Take 800 mg by mouth every 6 (six) hours as needed for pain.   Yes Historical Provider, MD  HYDROcodone-acetaminophen (NORCO) 5-325 MG per tablet Take 1-2 tablets by mouth every 4 (four) hours as needed for pain. Patient not taking: Reported on 01/04/2015 01/05/13   Tilford Pillar, MD   BP 144/67 mmHg  Pulse 64  Temp(Src) 98 F (36.7 C) (Oral)  Resp 14  Ht  (1.702 m)  Wt 155 lb (70.308 kg)  BMI 24.27 kg/m2  SpO2 100% Physical Exam  Constitutional: He appears well-developed and well-nourished. No distress.  HENT:  Head: Normocephalic and atraumatic.  Cardiovascular: Normal rate, regular rhythm, normal heart sounds and intact distal pulses.   No murmur heard. Pulmonary/Chest: Effort normal and breath sounds normal. No respiratory distress.  Abdominal: Soft. He exhibits no distension. There is no tenderness. There is no rebound and no guarding. Hernia confirmed negative in the right inguinal area.  Genitourinary: Testes normal and penis normal. Cremasteric reflex is present. Right testis shows no mass and no tenderness. Cremasteric reflex is not absent on the right side. Left testis shows no mass and no tenderness. Cremasteric reflex is not absent on the left side. Circumcised.  No obvious tenderness or swelling of the right testicle.  No masses.  No penile discharge, lesions.  Exam chaperoned by family member.    Musculoskeletal: Normal range of motion.  Lymphadenopathy:       Right: No inguinal adenopathy present.  Neurological: He is alert. Coordination normal.  Skin: Skin is warm and dry. No rash noted.  Nursing note and vitals reviewed.   ED  Course  Procedures (including critical care time) Labs Review Labs Reviewed  BASIC METABOLIC PANEL  CBC WITH DIFFERENTIAL/PLATELET  URINALYSIS, ROUTINE W REFLEX MICROSCOPIC (NOT AT [facility])  GC/CHLAMYDIA PROBE AMP ([place]) NOT AT [facility]    Imaging  Review  US Scrotum  01/04/2015   CLINICAL DATA:  19 year old male with right scrotal pain for 4 months.  EXAM: SCROTAL ULTRASOUND  DOPPLER ULTRASOUND OF THE TESTICLES  TECHNIQUE: Complete ultrasound examination of the testicles, epididymis, and other scrotal structures was performed. Color and spectral Doppler ultrasound were also utilized to evaluate blood flow to the testicles.  COMPARISON:  None.  FINDINGS: Right testicle  Measurements: 4.9 x 2.5 x 3.5 cm. No mass or microlithiasis visualized.  Left testicle  Measurements: 4.5 x 2.3 x 3.5 cm. No mass or microlithiasis visualized.  Right epididymis:  Normal in size and appearance.  Left epididymis:  Normal in size and appearance.  Hydrocele:  None visualized.  Varicocele:  None visualized.  Pulsed Doppler interrogation of both testes demonstrates normal low resistance arterial and venous waveforms bilaterally.  IMPRESSION: Normal scrotal ultrasound and Doppler.   Electronically Signed   By: Harmon Pier M.D.   On: 01/04/2015 09:44   [place] Art/ven Flow Abd Pelv Doppler  01/04/2015   CLINICAL DATA:  19 year old male with right scrotal pain for 4 months.  EXAM: SCROTAL ULTRASOUND  DOPPLER ULTRASOUND OF THE TESTICLES  TECHNIQUE: Complete ultrasound examination of the testicles, epididymis, and other scrotal structures was performed. Color and spectral Doppler ultrasound were also utilized to evaluate blood flow to the testicles.  COMPARISON:  None.  FINDINGS: Right testicle  Measurements: 4.9 x 2.5 x 3.5 cm. No mass or microlithiasis visualized.  Left testicle  Measurements: 4.5 x 2.3 x 3.5 cm. No mass or microlithiasis visualized.  Right epididymis:  Normal in size and appearance.  Left epididymis:  Normal in size and appearance.  Hydrocele:  None visualized.  Varicocele:  None visualized.  Pulsed Doppler interrogation of both testes demonstrates normal low resistance arterial and venous waveforms bilaterally.  IMPRESSION: Normal scrotal ultrasound and Doppler.    Electronically Signed   By: Harmon Pier M.D.   On: 01/04/2015 09:44    GC and chlamydia cultures pending  MDM   Final diagnoses:  Pain in right testicle    nml exam of the right testicle.  Labs and [place] reassuring.  Pt well appearing, vitals stable.  Urology referral given.  Pt stable for d/c and agrees to tx plan.  Advised to return for any worsening sx's.     Pauline Aus, PA-C 01/06/15 1909  Mancel Bale, MD 01/08/15 [phone]

## 2015-06-25 ENCOUNTER — Encounter: Payer: Self-pay | Admitting: Family Medicine

## 2015-06-25 ENCOUNTER — Ambulatory Visit (INDEPENDENT_AMBULATORY_CARE_PROVIDER_SITE_OTHER): Payer: Self-pay | Admitting: Family Medicine

## 2015-06-25 VITALS — BP 104/80 | Temp 98.2°F | Ht 66.0 in | Wt 158.2 lb

## 2015-06-25 DIAGNOSIS — N50811 Right testicular pain: Secondary | ICD-10-CM

## 2015-06-25 DIAGNOSIS — B081 Molluscum contagiosum: Secondary | ICD-10-CM

## 2015-06-25 DIAGNOSIS — R339 Retention of urine, unspecified: Secondary | ICD-10-CM

## 2015-06-25 LAB — POCT URINALYSIS DIPSTICK
PH UA: 7
Spec Grav, UA: <=1.005

## 2015-06-25 MED ORDER — AZITHROMYCIN 250 MG PO TABS: ORAL_TABLET | ORAL | Status: DC

## 2015-06-25 NOTE — Progress Notes (Signed)
   Subjective:    Patient ID: Bryan Dominguez, male    DOB: [birth year], 20 y.o.   MRN: [id number]  Testicle Pain The patient's primary symptoms include testicular pain. This is a new problem. The current episode started more than 1 month ago. The problem occurs intermittently. The problem has been unchanged. The pain is medium. Associated symptoms include urinary retention. Associated symptoms comments: Right sided testicular pain, discharge, penis pain. The testicular pain affects the right testicle. Nothing aggravates the symptoms. He has tried nothing for the symptoms. The treatment provided no relief. He is sexually active.  , no injury No fevers Some pain Some bumps on penis Testicle staying high on the right side Is a discomfort Stomach grumbles BM more diarrhea/loose  Review of Systems  Genitourinary: Positive for testicular pain.       Objective:   Physical Exam On physical exam abdomen soft lungs clear heart regular Genital area does show a papular rash but it appears to be more molluscum testicular exam normal on both sides without any significant tenderness       Assessment & Plan:   papular rash on the penile shaft I do not find any evidence of this being penile warts I think it's more likely molluscum. I will look into see if there are other treatment options available.   I doubt STD but we will go ahead and do a urine for GC chlamydia  Zithromax as prescribed Ongoing testicular pain if this doesn't get better with the antibiotic we will try 1 more round of antibiotic if still having trouble then referral to urology  addendum-patient's GC chlamydia test came back negative-with the molluscum contagiosum a prescription for Condylox 0.5% cream was recommended twice daily for 3 consecutive days per weekover the next 4 weeksplus also recommend dermatology referral if patient desires see phone message associated with GC chlamydia

## 2015-06-29 LAB — GC/CHLAMYDIA PROBE AMP
Chlamydia trachomatis, NAA: NEGATIVE
Neisseria gonorrhoeae by PCR: NEGATIVE

## 2015-07-02 ENCOUNTER — Encounter: Payer: Self-pay | Admitting: Family Medicine

## 2015-07-02 ENCOUNTER — Ambulatory Visit (INDEPENDENT_AMBULATORY_CARE_PROVIDER_SITE_OTHER): Payer: Self-pay | Admitting: Family Medicine

## 2015-07-02 VITALS — BP 114/78 | Ht 66.0 in | Wt 155.0 lb

## 2015-07-02 DIAGNOSIS — R339 Retention of urine, unspecified: Secondary | ICD-10-CM

## 2015-07-02 LAB — POCT URINALYSIS DIPSTICK
Blood, UA: NEGATIVE
LEUKOCYTES UA: NEGATIVE
PH UA: 7

## 2015-07-02 MED ORDER — PODOFILOX 0.5 % EX GEL: CUTANEOUS | Status: DC

## 2015-07-02 MED ORDER — CIPROFLOXACIN HCL 500 MG PO TABS: 500.0000 mg | ORAL_TABLET | Freq: Two times a day (BID) | ORAL | Status: DC

## 2015-07-02 NOTE — Progress Notes (Signed)
   Subjective:    Patient ID: Bryan Dominguez, male    DOB: 04/30/1996, 20 y.o.   MRN: [id number]  HPI  Patient in today for a recheck of urinary retention. Patient states symptoms subsided during therapy with Zpak. Since finishing course of Zpak symptoms have returned.  Also has c/o of anal pressure.  Denies rectal bleeding denies fever chills sweats denies dysuria states he feels pressure in his lower pelvis pressure in the prostate region Review of Systems     Objective:   Physical Exam  Lungs clear heart regular prostate exam is normal groin is normal  Gel applied/prescription for molluscum contagiosum    Assessment & Plan:  We will go ahead with additional antibiotics because this possibly could be a very mild prostatitis if he has ongoing troubles I'm told him he needs to call me and I will help set him up with urology either in [place] or at [facility]

## 2015-07-08 ENCOUNTER — Other Ambulatory Visit: Payer: Self-pay | Admitting: *Deleted

## 2015-07-08 MED ORDER — PODOFILOX 0.5 % EX SOLN: CUTANEOUS | Status: DC

## 2015-07-22 ENCOUNTER — Emergency Department (HOSPITAL_COMMUNITY)
Admission: EM | Admit: 2015-07-22 | Discharge: 2015-07-22 | Disposition: A | Discharge status: Home / Self Care | Payer: Self-pay | Attending: Emergency Medicine | Admitting: Emergency Medicine

## 2015-07-22 ENCOUNTER — Emergency Department (HOSPITAL_COMMUNITY): Payer: Self-pay

## 2015-07-22 ENCOUNTER — Encounter (HOSPITAL_COMMUNITY): Payer: Self-pay | Admitting: Emergency Medicine

## 2015-07-22 DIAGNOSIS — F17210 Nicotine dependence, cigarettes, uncomplicated: Secondary | ICD-10-CM | POA: Insufficient documentation

## 2015-07-22 DIAGNOSIS — S0181XA Laceration without foreign body of other part of head, initial encounter: Secondary | ICD-10-CM | POA: Insufficient documentation

## 2015-07-22 DIAGNOSIS — R55 Syncope and collapse: Secondary | ICD-10-CM | POA: Insufficient documentation

## 2015-07-22 DIAGNOSIS — W1830XA Fall on same level, unspecified, initial encounter: Secondary | ICD-10-CM | POA: Insufficient documentation

## 2015-07-22 DIAGNOSIS — W19XXXA Unspecified fall, initial encounter: Secondary | ICD-10-CM

## 2015-07-22 DIAGNOSIS — Z79899 Other long term (current) drug therapy: Secondary | ICD-10-CM | POA: Insufficient documentation

## 2015-07-22 DIAGNOSIS — Y929 Unspecified place or not applicable: Secondary | ICD-10-CM | POA: Insufficient documentation

## 2015-07-22 DIAGNOSIS — Y939 Activity, unspecified: Secondary | ICD-10-CM | POA: Insufficient documentation

## 2015-07-22 DIAGNOSIS — Z23 Encounter for immunization: Secondary | ICD-10-CM | POA: Insufficient documentation

## 2015-07-22 DIAGNOSIS — Y999 Unspecified external cause status: Secondary | ICD-10-CM | POA: Insufficient documentation

## 2015-07-22 LAB — CBC WITH DIFFERENTIAL/PLATELET
BASOS ABS: 0 10*3/uL (ref 0.0–0.1)
BASOS PCT: 0 %
EOS PCT: 2 %
Eosinophils Absolute: 0.3 10*3/uL (ref 0.0–0.7)
HCT: 45.3 % (ref 39.0–52.0)
Hemoglobin: 15.5 g/dL (ref 13.0–17.0)
LYMPHS PCT: 14 %
Lymphs Abs: 1.9 10*3/uL (ref 0.7–4.0)
MCH: 29.8 pg (ref 26.0–34.0)
MCHC: 34.2 g/dL (ref 30.0–36.0)
MCV: 87.1 fL (ref 78.0–100.0)
Monocytes Absolute: 1.1 10*3/uL — ABNORMAL HIGH (ref 0.1–1.0)
Monocytes Relative: 8 %
NEUTROS ABS: 10.2 10*3/uL — AB (ref 1.7–7.7)
Neutrophils Relative %: 76 %
PLATELETS: 192 10*3/uL (ref 150–400)
RBC: 5.2 MIL/uL (ref 4.22–5.81)
RDW: 12.8 % (ref 11.5–15.5)
WBC: 13.4 10*3/uL — AB (ref 4.0–10.5)

## 2015-07-22 LAB — RAPID URINE DRUG SCREEN, HOSP PERFORMED
AMPHETAMINES: NOT DETECTED
BARBITURATES: NOT DETECTED
Benzodiazepines: NOT DETECTED
Cocaine: NOT DETECTED
OPIATES: NOT DETECTED
TETRAHYDROCANNABINOL: POSITIVE — AB

## 2015-07-22 LAB — ETHANOL

## 2015-07-22 LAB — COMPREHENSIVE METABOLIC PANEL
ALBUMIN: 4.2 g/dL (ref 3.5–5.0)
ALT: 14 U/L — AB (ref 17–63)
AST: 19 U/L (ref 15–41)
Alkaline Phosphatase: 47 U/L (ref 38–126)
Anion gap: 5 (ref 5–15)
BUN: 15 mg/dL (ref 6–20)
CHLORIDE: 101 mmol/L (ref 101–111)
CO2: 30 mmol/L (ref 22–32)
CREATININE: 0.93 mg/dL (ref 0.61–1.24)
Calcium: 8.7 mg/dL — ABNORMAL LOW (ref 8.9–10.3)
GFR calc Af Amer: >60 mL/min (ref >60)
GLUCOSE: 90 mg/dL (ref 65–99)
Potassium: 4.2 mmol/L (ref 3.5–5.1)
Sodium: 136 mmol/L (ref 135–145)
Total Bilirubin: 1.6 mg/dL — ABNORMAL HIGH (ref 0.3–1.2)
Total Protein: 6.4 g/dL — ABNORMAL LOW (ref 6.5–8.1)

## 2015-07-22 MED ORDER — LIDOCAINE HCL (PF) 1 % IJ SOLN
INTRAMUSCULAR | Status: AC
  Administered 2015-07-22: 5 mL
  Filled 2015-07-22: qty 5

## 2015-07-22 MED ORDER — TETANUS-DIPHTH-ACELL PERTUSSIS 5-2.5-18.5 LF-MCG/0.5 IM SUSP
0.5000 mL | Freq: Once | INTRAMUSCULAR | Status: AC
  Administered 2015-07-22: 0.5 mL via INTRAMUSCULAR
  Filled 2015-07-22: qty 0.5

## 2015-07-22 MED ORDER — SODIUM CHLORIDE 0.9 % IV BOLUS (SEPSIS)
1000.0000 mL | Freq: Once | INTRAVENOUS | Status: AC
Start: 2015-07-22 — End: 2015-07-22
  Administered 2015-07-22: 1000 mL via INTRAVENOUS

## 2015-07-22 NOTE — Discharge Instructions (Signed)
Cleaned the laceration twice a day with soap and water. Return to have sutures out in 5-6 days or go to your family doctor to have him taken out

## 2015-07-22 NOTE — ED Notes (Addendum)
Patient arrives [facility] with c/o fall. Patient has been drinking and mixing narcotics (oxycodone) that he has been getting off the street, marijuana and Rocephin (on for testicular infection per patient). Patient had LOC today. Large laceration to underside of chin. Arrives alert/oriented. States only took 1/2 pill of oxycodone (unknown dose strength), smoked 1 blunt of MJ, and drank 24 oz beer.

## 2015-07-22 NOTE — ED Provider Notes (Signed)
CSN: [id number]     Arrival date & time 07/22/15  1201 History   First MD Initiated Contact with Patient 07/22/15 1314     Chief Complaint  Patient presents with  . Fall     (Consider location/radiation/quality/duration/timing/severity/associated sxs/prior Treatment) Patient is a 20 y.o. male presenting with fall. The history is provided by the patient (Patient states that he had a syncopal episode. He had been using narcotics and taking alcohol supposedly. Patient has a laceration to his chin).  Fall This is a new problem. The current episode started 3 to 5 hours ago. The problem occurs rarely. The problem has been resolved. Pertinent negatives include no chest pain, no abdominal pain and no headaches. Nothing aggravates the symptoms. Nothing relieves the symptoms.    History reviewed. No pertinent past medical history. Past Surgical History  Procedure Laterality Date  . Laparoscopic appendectomy N/A 01/05/2013    Procedure: APPENDECTOMY LAPAROSCOPIC;  Surgeon: Fabio Bering, MD;  Location: [facility];  Service: General;  Laterality: N/A;   No family history on file. Social History  Substance Use Topics  . Smoking status: Current Every Day Smoker -- 1.00 packs/day    Types: Cigarettes  . Smokeless tobacco: None  . Alcohol Use: Yes    Review of Systems  Constitutional: Negative for appetite change and fatigue.  HENT: Negative for congestion, ear discharge and sinus pressure.   Eyes: Negative for discharge.  Respiratory: Negative for cough.   Cardiovascular: Negative for chest pain.  Gastrointestinal: Negative for abdominal pain and diarrhea.  Genitourinary: Negative for frequency and hematuria.  Musculoskeletal: Negative for back pain.  Skin: Negative for rash.  Neurological: Positive for dizziness. Negative for seizures and headaches.  Psychiatric/Behavioral: Negative for hallucinations.      Allergies  Review of patient's allergies indicates no known allergies.  Home  Medications   Prior to Admission medications   Medication Sig Start Date End Date Taking? Authorizing Provider  ciprofloxacin (CIPRO) 500 MG tablet Take 1 tablet (500 mg total) by mouth 2 (two) times daily. 07/02/15  Yes Babs Sciara, MD  azithromycin (ZITHROMAX Z-PAK) 250 MG tablet Take 2 tablets (500 mg) on  Day 1,  followed by 1 tablet (250 mg) once daily on Days 2 through 5. Patient not taking: Reported on 07/02/2015 06/25/15   Babs Sciara, MD  podofilox (CONDYLOX) 0.5 % external solution Apply bid for 3 consecutive days per week for 4 weeks Patient not taking: Reported on 07/22/2015 07/08/15   Babs Sciara, MD   BP 119/62 mmHg  Pulse 63  Temp(Src) 97.9 F (36.6 C) (Oral)  Resp 13  SpO2 100% Physical Exam  Constitutional: He is oriented to person, place, and time. He appears well-developed.  HENT:  Head: Normocephalic.  3 cm superficial laceration to chin  Eyes: Conjunctivae and EOM are normal. No scleral icterus.  Neck: Neck supple. No thyromegaly present.  Cardiovascular: Normal rate and regular rhythm.  Exam reveals no gallop and no friction rub.   No murmur heard. Pulmonary/Chest: No stridor. He has no wheezes. He has no rales. He exhibits no tenderness.  Abdominal: He exhibits no distension. There is no tenderness. There is no rebound.  Musculoskeletal: Normal range of motion. He exhibits no edema.  Lymphadenopathy:    He has no cervical adenopathy.  Neurological: He is oriented to person, place, and time. He exhibits normal muscle tone. Coordination normal.  Skin: No rash noted. No erythema.  Psychiatric: He has a normal mood and affect.  His behavior is normal.    ED Course  .Marland KitchenLaceration Repair Date/Time: 07/22/2015 6:23 PM Performed by: Bethann Berkshire Authorized by: Bethann Berkshire Comments: She has a 3 cm laceration to chin superficial. Area was cleaned thoroughly with Betadine lidocaine without epi was used to numb the area 6 4-0 Prolene suture were used to close  laceration. Patient tolerated the procedure well   (including critical care time) Labs Review Labs Reviewed  CBC WITH DIFFERENTIAL/PLATELET - Abnormal; Notable for the following:    WBC 13.4 (*)    Neutro Abs 10.2 (*)    Monocytes Absolute 1.1 (*)    All other components within normal limits  COMPREHENSIVE METABOLIC PANEL - Abnormal; Notable for the following:    Calcium 8.7 (*)    Total Protein 6.4 (*)    ALT 14 (*)    Total Bilirubin 1.6 (*)    All other components within normal limits  URINE RAPID DRUG SCREEN, HOSP PERFORMED - Abnormal; Notable for the following:    Tetrahydrocannabinol POSITIVE (*)    All other components within normal limits  ETHANOL    Imaging Review Ct Head Wo Contrast  07/22/2015  CLINICAL DATA:  Fall. Intoxication. Laceration along the chin. Syncope. EXAM: CT HEAD WITHOUT CONTRAST CT MAXILLOFACIAL WITHOUT CONTRAST CT CERVICAL SPINE WITHOUT CONTRAST TECHNIQUE: Multidetector CT imaging of the head, cervical spine, and maxillofacial structures were performed using the standard protocol without intravenous contrast. Multiplanar CT image reconstructions of the cervical spine and maxillofacial structures were also generated. COMPARISON:  Report from 07/21/1998 FINDINGS: CT HEAD FINDINGS The brainstem, cerebellum, cerebral peduncles, thalami, basal ganglia, basilar cisterns, and ventricular system appear within normal limits. No intracranial hemorrhage, mass lesion, or acute CVA. Mucosal thickening in the ethmoid air cells noted. CT MAXILLOFACIAL FINDINGS Mild chronic right frontal sinusitis inferiorly with mucosal thickening and opacification of most of the ethmoid air cells. There is some mild mucosal thickening anteriorly in both of the sphenoid sinuses and also in both maxillary sinuses. Mastoid air cells and middle ears appear clear. There is a deep laceration of the soft tissues below the right mandibular body, extending all the way to the periosteal margin on image 51  series 9. No underlying bony abnormality or facial fracture identified. No significant tooth decay. Orbits unremarkable. CT CERVICAL SPINE FINDINGS No cervical spine fracture or significant abnormal subluxation. No prevertebral soft tissue swelling or significant bony lesion observed. 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 IMPRESSION: 1. Deep laceration below the right mandibular body extending all the way to the periosteal margin, but without underlying bony abnormality and without facial fracture. 2. No acute intracranial findings. 3. Chronic paranasal sinusitis. 4. 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 Electronically Signed   By: Gaylyn Rong M.D.   On: 07/22/2015 14:15   Ct Cervical Spine Wo Contrast  07/22/2015  CLINICAL DATA:  Fall. Intoxication. Laceration along the chin. Syncope. EXAM: CT HEAD WITHOUT CONTRAST CT MAXILLOFACIAL WITHOUT CONTRAST CT CERVICAL SPINE WITHOUT CONTRAST TECHNIQUE: Multidetector CT imaging of the head, cervical spine, and maxillofacial structures were performed using the standard protocol without intravenous contrast. Multiplanar CT image reconstructions of the cervical spine and maxillofacial structures were also generated. COMPARISON:  Report from 07/21/1998 FINDINGS: CT HEAD FINDINGS The brainstem, cerebellum, cerebral peduncles, thalami, basal ganglia,  basilar cisterns, and ventricular system appear within normal limits. No intracranial hemorrhage, mass lesion, or acute CVA. Mucosal thickening in the ethmoid air cells noted. CT MAXILLOFACIAL FINDINGS Mild chronic right frontal sinusitis inferiorly with mucosal thickening and opacification of most of the ethmoid air cells. There is some mild mucosal thickening anteriorly in both of the sphenoid  sinuses and also in both maxillary sinuses. Mastoid air cells and middle ears appear clear. There is a deep laceration of the soft tissues below the right mandibular body, extending all the way to the periosteal margin on image 51 series 9. No underlying bony abnormality or facial fracture identified. No significant tooth decay. Orbits unremarkable. CT CERVICAL SPINE FINDINGS No cervical spine fracture or significant abnormal subluxation. No prevertebral soft tissue swelling or significant bony lesion observed. 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 IMPRESSION: 1. Deep laceration below the right mandibular body extending all the way to the periosteal margin, but without underlying bony abnormality and without facial fracture. 2. No acute intracranial findings. 3. Chronic paranasal sinusitis. 4. 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 Electronically Signed   By: Gaylyn Rong M.D.   On: 07/22/2015 14:15   Ct Maxillofacial Wo Cm  07/22/2015  CLINICAL DATA:  Fall. Intoxication. Laceration along the chin. Syncope. EXAM: CT HEAD WITHOUT CONTRAST CT MAXILLOFACIAL WITHOUT CONTRAST CT CERVICAL SPINE WITHOUT CONTRAST TECHNIQUE: Multidetector CT imaging of the head, cervical spine, and maxillofacial structures were performed using the standard protocol without intravenous contrast. Multiplanar CT image reconstructions of the cervical spine and maxillofacial structures were also generated. COMPARISON:  Report from 07/21/1998 FINDINGS: CT HEAD FINDINGS The brainstem, cerebellum, cerebral peduncles, thalami, basal ganglia, basilar cisterns, and ventricular system appear within normal limits. No intracranial hemorrhage, mass lesion, or acute CVA. Mucosal thickening in the ethmoid air cells noted. CT  MAXILLOFACIAL FINDINGS Mild chronic right frontal sinusitis inferiorly with mucosal thickening and opacification of most of the ethmoid air cells. There is some mild mucosal thickening anteriorly in both of the sphenoid sinuses and also in both maxillary sinuses. Mastoid air cells and middle ears appear clear. There is a deep laceration of the soft tissues below the right mandibular body, extending all the way to the periosteal margin on image 51 series 9. No underlying bony abnormality or facial fracture identified. No significant tooth decay. Orbits unremarkable. CT CERVICAL SPINE FINDINGS No cervical spine fracture or significant abnormal subluxation. No prevertebral soft tissue swelling or significant bony lesion observed. 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 IMPRESSION: 1. Deep laceration below the right mandibular body extending all the way to the periosteal margin, but without underlying bony abnormality and without facial fracture. 2. No acute intracranial findings. 3. Chronic paranasal sinusitis. 4. 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 Electronically Signed   By: Gaylyn Rong M.D.   On: 07/22/2015 14:15   I have personally reviewed and evaluated these images and lab results as part of my medical decision-making.   EKG Interpretation None      MDM   Final diagnoses:  Syncope and collapse  Laceration of chin, initial encounter    Labs and EKG unremarkable. Patient with a fall and laceration to chin. He will follow-up in 5-6 days to have sutures removed    Bethann BerkshireJoseph Linnie Delgrande, MD 07/22/15 1824

## 2015-07-29 ENCOUNTER — Ambulatory Visit (INDEPENDENT_AMBULATORY_CARE_PROVIDER_SITE_OTHER): Payer: Self-pay | Admitting: Family Medicine

## 2015-07-29 ENCOUNTER — Encounter: Payer: Self-pay | Admitting: Family Medicine

## 2015-07-29 VITALS — BP 120/80 | Ht 66.0 in | Wt 154.4 lb

## 2015-07-29 DIAGNOSIS — B081 Molluscum contagiosum: Secondary | ICD-10-CM
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
## 2016-04-01 ENCOUNTER — Emergency Department (HOSPITAL_COMMUNITY): Payer: Self-pay

## 2016-04-01 ENCOUNTER — Emergency Department (HOSPITAL_COMMUNITY)
Admission: EM | Admit: 2016-04-01 | Discharge: 2016-04-02 | Disposition: A | Discharge status: Home / Self Care | Payer: Self-pay | Attending: Emergency Medicine | Admitting: Emergency Medicine

## 2016-04-01 ENCOUNTER — Encounter (HOSPITAL_COMMUNITY): Payer: Self-pay | Admitting: Emergency Medicine

## 2016-04-01 DIAGNOSIS — S80211A Abrasion, right knee, initial encounter: Secondary | ICD-10-CM | POA: Insufficient documentation

## 2016-04-01 DIAGNOSIS — F17210 Nicotine dependence, cigarettes, uncomplicated: Secondary | ICD-10-CM | POA: Insufficient documentation

## 2016-04-01 DIAGNOSIS — S161XXA Strain of muscle, fascia and tendon at neck level, initial encounter: Secondary | ICD-10-CM | POA: Insufficient documentation

## 2016-04-01 DIAGNOSIS — F1490 Cocaine use, unspecified, uncomplicated: Secondary | ICD-10-CM | POA: Insufficient documentation

## 2016-04-01 DIAGNOSIS — Y999 Unspecified external cause status: Secondary | ICD-10-CM | POA: Insufficient documentation

## 2016-04-01 DIAGNOSIS — S70212A Abrasion, left hip, initial encounter: Secondary | ICD-10-CM | POA: Insufficient documentation

## 2016-04-01 DIAGNOSIS — Y92410 Unspecified street and highway as the place of occurrence of the external cause: Secondary | ICD-10-CM | POA: Insufficient documentation

## 2016-04-01 DIAGNOSIS — S60512A Abrasion of left hand, initial encounter: Secondary | ICD-10-CM | POA: Insufficient documentation

## 2016-04-01 DIAGNOSIS — S80212A Abrasion, left knee, initial encounter: Secondary | ICD-10-CM | POA: Insufficient documentation

## 2016-04-01 DIAGNOSIS — S0083XA Contusion of other part of head, initial encounter: Secondary | ICD-10-CM | POA: Insufficient documentation

## 2016-04-01 DIAGNOSIS — Y9389 Activity, other specified: Secondary | ICD-10-CM | POA: Insufficient documentation

## 2016-04-01 DIAGNOSIS — T07XXXA Unspecified multiple injuries, initial encounter: Secondary | ICD-10-CM

## 2016-04-01 MED ORDER — IBUPROFEN 800 MG PO TABS
800.0000 mg | ORAL_TABLET | Freq: Once | ORAL | Status: AC
  Administered 2016-04-01: 800 mg via ORAL
  Filled 2016-04-01: qty 1

## 2016-04-01 MED ORDER — CYCLOBENZAPRINE HCL 10 MG PO TABS
10.0000 mg | ORAL_TABLET | Freq: Once | ORAL | Status: AC
  Administered 2016-04-01: 10 mg via ORAL
  Filled 2016-04-01: qty 1
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
## 2016-04-01 NOTE — ED Notes (Signed)
Patient transported to CT 

## 2016-04-02 MED ORDER — IBUPROFEN 800 MG PO TABS: 800.0000 mg | ORAL_TABLET | Freq: Three times a day (TID) | ORAL | 0 refills | Status: DC

## 2016-04-02 MED ORDER — CYCLOBENZAPRINE HCL 10 MG PO TABS: 10.0000 mg | ORAL_TABLET | Freq: Three times a day (TID) | ORAL | 0 refills | Status: DC | PRN

## 2016-04-02 NOTE — Discharge Instructions (Signed)
Apply ice packs on/off to the affected areas, avoid heat.  Clean the abrasions with mild soap and water.  Follow-up with your doctor for recheck.  Return here for any worsening symptoms
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
## 2016-04-04 ENCOUNTER — Ambulatory Visit (INDEPENDENT_AMBULATORY_CARE_PROVIDER_SITE_OTHER): Payer: Self-pay | Admitting: Family Medicine

## 2016-04-04 ENCOUNTER — Encounter: Payer: Self-pay | Admitting: Family Medicine

## 2016-04-04 VITALS — Temp 98.7°F | Ht 66.0 in | Wt 160.0 lb

## 2016-04-04 DIAGNOSIS — R300 Dysuria: Secondary | ICD-10-CM

## 2016-04-04 DIAGNOSIS — N489 Disorder of penis, unspecified: Secondary | ICD-10-CM

## 2016-04-04 LAB — POCT URINALYSIS DIPSTICK
SPEC GRAV UA: 1.015
pH, UA: 7

## 2016-04-04 MED ORDER — DOXYCYCLINE HYCLATE 100 MG PO CAPS: 100.0000 mg | ORAL_CAPSULE | Freq: Two times a day (BID) | ORAL | 0 refills | Status: DC
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
## 2016-04-05 LAB — GC/CHLAMYDIA PROBE AMP
Chlamydia trachomatis, NAA: NEGATIVE
Neisseria gonorrhoeae by PCR: NEGATIVE

## 2016-04-07 LAB — HERPES SIMPLEX VIRUS CULTURE

## 2016-04-13 ENCOUNTER — Other Ambulatory Visit: Payer: Self-pay | Admitting: Family Medicine

## 2016-04-13 MED ORDER — VALACYCLOVIR HCL 500 MG PO TABS: 500.0000 mg | ORAL_TABLET | Freq: Every day | ORAL | 12 refills | Status: DC

## 2016-08-30 ENCOUNTER — Ambulatory Visit (INDEPENDENT_AMBULATORY_CARE_PROVIDER_SITE_OTHER): Payer: 59 | Admitting: Family Medicine

## 2016-08-30 ENCOUNTER — Encounter: Payer: Self-pay | Admitting: Family Medicine

## 2016-08-30 ENCOUNTER — Other Ambulatory Visit: Payer: Self-pay | Admitting: Family Medicine

## 2016-08-30 VITALS — BP 128/88 | Temp 98.3°F | Ht 66.0 in | Wt 167.4 lb

## 2016-08-30 DIAGNOSIS — N50811 Right testicular pain: Secondary | ICD-10-CM

## 2016-08-30 DIAGNOSIS — R1030 Lower abdominal pain, unspecified: Secondary | ICD-10-CM

## 2016-08-30 DIAGNOSIS — N6310 Unspecified lump in the right breast, unspecified quadrant: Secondary | ICD-10-CM | POA: Diagnosis not present

## 2016-08-30 LAB — POCT URINALYSIS DIPSTICK
Spec Grav, UA: 1.015 (ref 1.010–1.025)
pH, UA: 7 (ref 5.0–8.0)

## 2016-08-30 NOTE — Progress Notes (Signed)
   Subjective:    Patient ID: Bryan Dominguez, male    DOB: [birth year], 21 y.o.   MRN: [id number]  Abdominal Pain  This is a recurrent problem. The current episode started more than 1 year ago. The onset quality is gradual. The problem occurs constantly. The problem has been rapidly worsening. The quality of the pain is aching. The abdominal pain radiates to the RLQ. Associated symptoms include constipation, diarrhea, frequency and nausea. Pertinent negatives include no fever.  This gentleman is having some lower abdominal discomfort radiates into the testicle is been going on for several months off and on for about a year he denies reflux symptoms denies rectal bleeding no hematuria  He also has a lump underneath the right nipple region that has been present for the past few months no pain or discomfort. Seems to be getting a little bit bigger  chest,back,scrotum,and abd pain  Review of Systems  Constitutional: Negative for fatigue and fever.  Respiratory: Negative for shortness of breath.   Cardiovascular: Negative for chest pain.  Gastrointestinal: Positive for abdominal pain, constipation, diarrhea and nausea.  Genitourinary: Positive for difficulty urinating, frequency and testicular pain.       Objective:   Physical Exam  Constitutional: He appears well-nourished. No distress.  Cardiovascular: Normal rate, regular rhythm and normal heart sounds.   No murmur heard. Pulmonary/Chest: Effort normal and breath sounds normal. No respiratory distress.  Abdominal: Soft. He exhibits no distension. There is no tenderness.  Musculoskeletal: He exhibits no edema.  Lymphadenopathy:    He has no cervical adenopathy.  Neurological: He is alert.  Psychiatric: His behavior is normal.  Vitals reviewed. Prostate exam normal urinalysis normal  He does have a small growth underneath the right nipple it is not hard feels fairly soft but it is bigger on the right than the left  Testicular exam  is normal. No tenderness or pain but the right testicle is larger than the left vas deferens is felt verrucous see a possible      Assessment & Plan:  Referral to urology because of persistent lower abdominal right testicular pain ultrasound of the testicle hold off on CAT scan currently  Right nipple growth referral to Dr. Lovell Sheehan for evaluation I doubt male breast cancer will see what he has to say

## 2016-08-31 LAB — BASIC METABOLIC PANEL
BUN/Creatinine Ratio: 17 (ref 9–20)
BUN: 15 mg/dL (ref 6–20)
CALCIUM: 9.6 mg/dL (ref 8.7–10.2)
CHLORIDE: 100 mmol/L (ref 96–106)
CO2: 25 mmol/L (ref 18–29)
Creatinine, Ser: 0.87 mg/dL (ref 0.76–1.27)
GFR calc non Af Amer: 123 mL/min/{1.73_m2} (ref >59)
GFR, EST AFRICAN AMERICAN: 142 mL/min/{1.73_m2} (ref >59)
Glucose: 90 mg/dL (ref 65–99)
Potassium: 4.3 mmol/L (ref 3.5–5.2)
Sodium: 142 mmol/L (ref 134–144)

## 2016-08-31 LAB — CBC WITH DIFFERENTIAL/PLATELET
BASOS: 0 %
Basophils Absolute: 0 10*3/uL (ref 0.0–0.2)
EOS (ABSOLUTE): 0.2 10*3/uL (ref 0.0–0.4)
EOS: 2 %
HEMATOCRIT: 47.9 % (ref 37.5–51.0)
HEMOGLOBIN: 16.3 g/dL (ref 13.0–17.7)
Immature Grans (Abs): 0 10*3/uL (ref 0.0–0.1)
Immature Granulocytes: 0 %
LYMPHS ABS: 2.1 10*3/uL (ref 0.7–3.1)
Lymphs: 26 %
MCH: 29.5 pg (ref 26.6–33.0)
MCHC: 34 g/dL (ref 31.5–35.7)
MCV: 87 fL (ref 79–97)
MONOCYTES: 11 %
MONOS ABS: 0.9 10*3/uL (ref 0.1–0.9)
NEUTROS ABS: 4.9 10*3/uL (ref 1.4–7.0)
Neutrophils: 61 %
Platelets: 217 10*3/uL (ref 150–379)
RBC: 5.52 x10E6/uL (ref 4.14–5.80)
RDW: 13.8 % (ref 12.3–15.4)
WBC: 8.1 10*3/uL (ref 3.4–10.8)

## 2016-08-31 LAB — HEPATIC FUNCTION PANEL
ALBUMIN: 5.1 g/dL (ref 3.5–5.5)
ALT: 14 IU/L (ref 0–44)
AST: 20 IU/L (ref 0–40)
Alkaline Phosphatase: 61 IU/L (ref 39–117)
BILIRUBIN TOTAL: 1.2 mg/dL (ref 0.0–1.2)
BILIRUBIN, DIRECT: 0.28 mg/dL (ref 0.00–0.40)
Total Protein: 7.1 g/dL (ref 6.0–8.5)

## 2016-09-01 ENCOUNTER — Ambulatory Visit (HOSPITAL_COMMUNITY)
Admission: RE | Admit: 2016-09-01 | Discharge: 2016-09-01 | Disposition: A | Discharge status: Home / Self Care | Payer: Managed Care, Other (non HMO) | Source: Ambulatory Visit | Attending: Family Medicine | Admitting: Family Medicine

## 2016-09-01 DIAGNOSIS — R1030 Lower abdominal pain, unspecified: Secondary | ICD-10-CM | POA: Insufficient documentation

## 2016-09-01 DIAGNOSIS — N50811 Right testicular pain: Secondary | ICD-10-CM

## 2016-09-04 ENCOUNTER — Encounter: Payer: Self-pay | Admitting: Family Medicine

## 2016-09-05 ENCOUNTER — Encounter: Payer: Self-pay | Admitting: Family Medicine

## 2016-09-11 ENCOUNTER — Encounter: Payer: Self-pay | Admitting: Family Medicine
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
## 2018-07-17 ENCOUNTER — Other Ambulatory Visit: Payer: Self-pay

## 2018-07-17 ENCOUNTER — Emergency Department (HOSPITAL_COMMUNITY): Payer: Self-pay

## 2018-07-17 ENCOUNTER — Encounter (HOSPITAL_COMMUNITY): Payer: Self-pay

## 2018-07-17 ENCOUNTER — Emergency Department (HOSPITAL_COMMUNITY)
Admission: EM | Admit: 2018-07-17 | Discharge: 2018-07-17 | Disposition: A | Discharge status: Home / Self Care | Payer: Self-pay | Attending: Emergency Medicine | Admitting: Emergency Medicine

## 2018-07-17 DIAGNOSIS — K625 Hemorrhage of anus and rectum: Secondary | ICD-10-CM | POA: Insufficient documentation

## 2018-07-17 DIAGNOSIS — K5790 Diverticulosis of intestine, part unspecified, without perforation or abscess without bleeding: Secondary | ICD-10-CM | POA: Insufficient documentation

## 2018-07-17 DIAGNOSIS — F17210 Nicotine dependence, cigarettes, uncomplicated: Secondary | ICD-10-CM | POA: Insufficient documentation

## 2018-07-17 LAB — COMPREHENSIVE METABOLIC PANEL
ALT: 48 U/L — ABNORMAL HIGH (ref 0–44)
AST: 25 U/L (ref 15–41)
Albumin: 4.4 g/dL (ref 3.5–5.0)
Alkaline Phosphatase: 62 U/L (ref 38–126)
Anion gap: 6 (ref 5–15)
BILIRUBIN TOTAL: 0.9 mg/dL (ref 0.3–1.2)
BUN: 21 mg/dL — AB (ref 6–20)
CO2: 26 mmol/L (ref 22–32)
Calcium: 8.8 mg/dL — ABNORMAL LOW (ref 8.9–10.3)
Chloride: 109 mmol/L (ref 98–111)
Creatinine, Ser: 0.82 mg/dL (ref 0.61–1.24)
GFR calc Af Amer: >60 mL/min (ref >60)
GFR calc non Af Amer: >60 mL/min (ref >60)
Glucose, Bld: 86 mg/dL (ref 70–99)
Potassium: 4.2 mmol/L (ref 3.5–5.1)
Sodium: 141 mmol/L (ref 135–145)
Total Protein: 6.9 g/dL (ref 6.5–8.1)

## 2018-07-17 LAB — CBC
HCT: 50 % (ref 39.0–52.0)
Hemoglobin: 15.7 g/dL (ref 13.0–17.0)
MCH: 28.4 pg (ref 26.0–34.0)
MCHC: 31.4 g/dL (ref 30.0–36.0)
MCV: 90.6 fL (ref 80.0–100.0)
PLATELETS: 253 10*3/uL (ref 150–400)
RBC: 5.52 MIL/uL (ref 4.22–5.81)
RDW: 13.2 % (ref 11.5–15.5)
WBC: 6.9 10*3/uL (ref 4.0–10.5)
nRBC: 0 % (ref 0.0–0.2)

## 2018-07-17 LAB — TYPE AND SCREEN
ABO/RH(D): O POS
Antibody Screen: NEGATIVE

## 2018-07-17 MED ORDER — IOHEXOL 300 MG/ML  SOLN
100.0000 mL | Freq: Once | INTRAMUSCULAR | Status: AC | PRN
  Administered 2018-07-17: 100 mL via INTRAVENOUS

## 2018-07-17 NOTE — ED Provider Notes (Signed)
Emergency Department Provider Note   I have reviewed the triage vital signs and the nursing notes.   HISTORY  Chief Complaint Rectal Bleeding   HPI Bryan Dominguez is a 23 y.o. male with no significant PMH presents to the emergency department for evaluation of blood in his bowel movements starting today.  Patient states he is noticed some bright red blood with wiping over several weeks but switched his toilet paper and felt like this went away.  He describes diffuse, lower abdominal pain which is been intermittent but present over the past 1 to 2 years.  He has not seen his primary care physician regarding this or had any imaging or further work-up.  No prior episodes of blood in the stool.  He has had 2 weeks of diarrhea but states that the lower abdominal pain is not worse.  No travel.  No fevers or chills.  No vomiting.  No chest pain or shortness of breath.  Patient is not feeling lightheaded.  He notes that his dad has issues with abdominal pain and gets frequent colonoscopy because of multiple polyps but denies any known history of early colon cancer in his family.   History reviewed. No pertinent past medical history.  Patient Active Problem List   Diagnosis Date Noted  . ANKLE SPRAIN 02/15/2009  . WRIST PAIN, RIGHT 02/01/2009  . SEVER'S DISEASE-OSTEOCHONDROSIS 02/01/2009  . WRIST SPRAIN, RIGHT 02/01/2009    Past Surgical History:  Procedure Laterality Date  . APPENDECTOMY    . LAPAROSCOPIC APPENDECTOMY N/A 01/05/2013   Procedure: APPENDECTOMY LAPAROSCOPIC;  Surgeon: Fabio Bering, MD;  Location: [facility];  Service: General;  Laterality: N/A;   Allergies Patient has no known allergies.  Family History  Problem Relation Age of Onset  . Diabetes Mother   . Hypertension Mother   . Diabetes Other   . Hypertension Other     Social History Social History   Tobacco Use  . Smoking status: Current Every Day Smoker    Packs/day: 1.00    Types: Cigarettes  . Smokeless  tobacco: Never Used  Substance Use Topics  . Alcohol use: Yes    Comment: 2- 40 oz per day  . Drug use: Yes    Types: Marijuana    Comment: yesterday    Review of Systems  Constitutional: No fever/chills Eyes: No visual changes. ENT: No sore throat. Cardiovascular: Denies chest pain. Respiratory: Denies shortness of breath. Gastrointestinal: Positive chronic intermittent lower abdominal pain.  No nausea, no vomiting. Positive diarrhea x 2 weeks.  No constipation. Positive blood in the stools.  Genitourinary: Negative for dysuria. Musculoskeletal: Negative for back pain. Skin: Negative for rash. Neurological: Negative for headaches, focal weakness or numbness.  10-point ROS otherwise negative.  ____________________________________________   PHYSICAL EXAM:  VITAL SIGNS: ED Triage Vitals [07/17/18 1436]  Enc Vitals Group     BP (!) 182/98     Pulse Rate 80     Resp 20     Temp 98.4 F (36.9 C)     Temp Source Oral     SpO2 99 %     Pain Score 5   Constitutional: Alert and oriented. Well appearing and in no acute distress. Eyes: Conjunctivae are normal. Head: Atraumatic. Nose: No congestion/rhinnorhea. Mouth/Throat: Mucous membranes are moist.  Neck: No stridor.   Cardiovascular: Normal rate, regular rhythm. Good peripheral circulation. Grossly normal heart sounds.   Respiratory: Normal respiratory effort.  No retractions. Lungs CTAB. Gastrointestinal: Soft and nontender. No  distention. Rectal exam performed after obtaining verbal consent. Patient wanted girlfriend to remain at bedside. No hemorrhoids or fissures. Brown stool without gross blood or melena.  Musculoskeletal: No lower extremity tenderness nor edema. No gross deformities of extremities. Neurologic:  Normal speech and language. No gross focal neurologic deficits are appreciated.  Skin:  Skin is warm, dry and intact. No rash noted.  ____________________________________________   LABS (all labs ordered  are listed, but only abnormal results are displayed)  Labs Reviewed  COMPREHENSIVE METABOLIC PANEL - Abnormal; Notable for the following components:      Result Value   BUN 21 (*)    Calcium 8.8 (*)    ALT 48 (*)    All other components within normal limits  CBC  POC OCCULT BLOOD, ED  POC OCCULT BLOOD, ED  TYPE AND SCREEN   ____________________________________________  RADIOLOGY  Ct Abdomen Pelvis W Contrast  Result Date: 07/17/2018 CLINICAL DATA:  Acute generalized abdominal pain. Blood in toilet water. History of appendectomy. EXAM: CT ABDOMEN AND PELVIS WITH CONTRAST TECHNIQUE: Multidetector CT imaging of the abdomen and pelvis was performed using the standard protocol following bolus administration of intravenous contrast. CONTRAST:  OMNIPAQUE IOHEXOL 300 MG/ML  SOLN COMPARISON:  01/05/2013 FINDINGS: Lower chest: Bilateral gynecomastia. Top normal heart size without pericardial effusion. Bibasilar dependent atelectasis. Hepatobiliary: No focal liver abnormality is seen. No gallstones, gallbladder wall thickening, or biliary dilatation. Pancreas: Unremarkable. No pancreatic ductal dilatation or surrounding inflammatory changes. Spleen: Normal in size without focal abnormality. Adrenals/Urinary Tract: Bilateral adrenal calcifications may reflect stigmata of old adrenal hemorrhage. Symmetric cortical enhancement of both kidneys. No renal mass. No nephrolithiasis nor obstructive uropathy. The urinary bladder is unremarkable. Stomach/Bowel: The stomach is physiologically distended in appearance. The duodenal bulb, sweep and ligament of Treitz are normal. No small bowel obstruction or inflammation. Status post appendectomy. Moderate stool retention within the colon. Scattered colonic diverticulosis is identified greatest along the sigmoid colon. No acute large bowel inflammation or evidence of diverticulitis is seen. Vascular/Lymphatic: No significant vascular findings are present. No enlarged  abdominal or pelvic lymph nodes. Reproductive: Prostate is unremarkable. Other: Small fat containing periumbilical hernia. No free air nor free fluid. Musculoskeletal: No acute or significant osseous findings. IMPRESSION: 1. Advanced for age sigmoid diverticulosis without acute diverticulitis or colitis. A bleeding diverticulum might account for the patient's blood in stool though none is actively bleeding on current exam. 2. Small periumbilical fat containing hernia without incarceration. 3. 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 Electronically Signed   By: Tollie Eth M.D.   On: 07/17/2018 19:19    ____________________________________________   PROCEDURES  Procedure(s) performed:   Procedures  None  ____________________________________________   INITIAL IMPRESSION / ASSESSMENT AND PLAN / ED COURSE  Pertinent labs & imaging results that were available during my care of the patient were reviewed by me and considered in my medical decision making (see chart for details).  Patient presents to the emergency department with chronic lower abdominal pain but new onset bleeding in the bowel movements.  No bright red blood or melena on exam.  Normal external exam of the anus.  Question history of multiple polyps per patient has his dad obtains very frequent colonoscopies.  No known history of inflammatory bowel disease.  Labs from triage show no anemia or leukocytosis.  Patient does have elevated blood pressure here but no evidence of HTN emergency. Plan for CT abdomen/pelvis with abdominal pain and blood in BMs to screen for mass  vs inflammatory bowel process.   CT with diverticulosis but no diverticulitis. No active bleeding. Labs without anemia. Plan for PCP and GI follow up. Provided contact information regarding follow up with each. Plan for discharge but discussed ED return precautions.   Hemoccult negative.   ____________________________________________  FINAL CLINICAL IMPRESSION(S) / ED DIAGNOSES  Final diagnoses:  Rectal bleeding  Diverticulosis    MEDICATIONS GIVEN DURING THIS VISIT:  Medications  iohexol (OMNIPAQUE) 300 MG/ML solution 100 mL (100 mLs Intravenous Contrast Given 07/17/18 1839)    Note:  This document was prepared using Dragon voice recognition software and may include unintentional dictation errors.  Alona Bene, MD Emergency Medicine    , Arlyss Repress, MD 07/17/18 2131

## 2018-07-17 NOTE — Discharge Instructions (Signed)
You were seen today with bleeding. You have diverticulosis which may cause some intermittent bleeding. Call the Gastroenterologist listed to schedule a follow up appointment and return to the ED with any sudden worsening bleeding, lightheadedness, fever, chills, or abdominal pain.

## 2018-07-17 NOTE — ED Triage Notes (Signed)
Pt reports dark blood and blood clots in stool this morning. Reports lower abdominal pain. Pt reports blood in toilet water

## 2021-05-31 ENCOUNTER — Other Ambulatory Visit: Payer: Self-pay

## 2021-05-31 ENCOUNTER — Ambulatory Visit
Admission: RE | Admit: 2021-05-31 | Discharge: 2021-05-31 | Disposition: A | Discharge status: Home / Self Care | Payer: Self-pay | Source: Ambulatory Visit | Attending: Student | Admitting: Student

## 2021-05-31 VITALS — BP 139/91 | HR 105 | Temp 98.9°F | Resp 18

## 2021-05-31 DIAGNOSIS — H66003 Acute suppurative otitis media without spontaneous rupture of ear drum, bilateral: Secondary | ICD-10-CM

## 2021-05-31 HISTORY — DX: Diverticulosis of intestine, part unspecified, without perforation or abscess without bleeding: K57.90

## 2021-05-31 MED ORDER — AMOXICILLIN 875 MG PO TABS: 875.0000 mg | ORAL_TABLET | Freq: Two times a day (BID) | ORAL | 0 refills | Status: AC

## 2021-05-31 NOTE — ED Triage Notes (Signed)
Congestion, runny nose x 1 month.  States he can hardly hear.  Right ear pain, sore throat x 1 month.

## 2021-05-31 NOTE — ED Provider Notes (Signed)
[facility]    CSN: [id number] Arrival date & time: 05/31/21  1247      History   Chief Complaint No chief complaint on file.   HPI Bryan Dominguez is a 26 y.o. male presenting with concern for sinusitis.  Medical history noncontributory. Describes ear pain and sinus pressure x3 weeks following viral URI. Initially with cough and congestion, this has resolved. Now bilateral ear pressure with muffled hearing. Denies dizziness, tinnitus, fevers/chills.   HPI  Past Medical History:  Diagnosis Date   Diverticulosis     Patient Active Problem List   Diagnosis Date Noted   ANKLE SPRAIN 02/15/2009   WRIST PAIN, RIGHT 02/01/2009   SEVER'S DISEASE-OSTEOCHONDROSIS 02/01/2009   WRIST SPRAIN, RIGHT 02/01/2009    Past Surgical History:  Procedure Laterality Date   APPENDECTOMY     LAPAROSCOPIC APPENDECTOMY N/A 01/05/2013   Procedure: APPENDECTOMY LAPAROSCOPIC;  Surgeon: Donato Heinz, MD;  Location: [facility];  Service: General;  Laterality: N/A;       Home Medications    Prior to Admission medications   Medication Sig Start Date End Date Taking? Authorizing Provider  amoxicillin (AMOXIL) 875 MG tablet Take 1 tablet (875 mg total) by mouth 2 (two) times daily for 7 days. 05/31/21 06/07/21 Yes Hazel Sams, PA-C    Family History Family History  Problem Relation Age of Onset   Diabetes Mother    Hypertension Mother    Diabetes Other    Hypertension Other     Social History Social History   Tobacco Use   Smoking status: Every Day    Packs/day: 1.00    Types: Cigarettes   Smokeless tobacco: Never  Vaping Use   Vaping Use: Never used  Substance Use Topics   Alcohol use: Yes    Comment: 2- 40 oz per day   Drug use: Yes    Types: Marijuana    Comment: yesterday     Allergies   Patient has no known allergies.   Review of Systems Review of Systems  HENT:  Positive for ear pain, hearing loss and sinus pressure.   All other systems reviewed and  are negative.   Physical Exam Triage Vital Signs ED Triage Vitals  Enc Vitals Group     BP      Pulse      Resp      Temp      Temp src      SpO2      Weight      Height      Head Circumference      Peak Flow      Pain Score      Pain Loc      Pain Edu?      Excl. in [place]?    No data found.  Updated Vital Signs BP (!) 139/91 (BP Location: Right Arm)    Pulse (!) 105    Temp 98.9 F (37.2 C) (Oral)    Resp 18    SpO2 97%   Visual Acuity Right Eye Distance:   Left Eye Distance:   Bilateral Distance:    Right Eye Near:   Left Eye Near:    Bilateral Near:     Physical Exam Vitals reviewed.  Constitutional:      Appearance: Normal appearance. He is not ill-appearing.  HENT:     Head: Normocephalic and atraumatic.     Right Ear: Hearing, ear canal and external ear normal. No swelling or  tenderness. No middle ear effusion. There is no impacted cerumen. No mastoid tenderness. Tympanic membrane is erythematous and bulging. Tympanic membrane is not injected, scarred, perforated or retracted.     Left Ear: Hearing, ear canal and external ear normal. No swelling or tenderness.  No middle ear effusion. There is no impacted cerumen. No mastoid tenderness. Tympanic membrane is erythematous and bulging. Tympanic membrane is not injected, scarred, perforated or retracted.     Mouth/Throat:     Pharynx: Oropharynx is clear. No oropharyngeal exudate or posterior oropharyngeal erythema.  Cardiovascular:     Rate and Rhythm: Normal rate and regular rhythm.     Heart sounds: Normal heart sounds.  Pulmonary:     Effort: Pulmonary effort is normal.     Breath sounds: Normal breath sounds.  Lymphadenopathy:     Cervical: No cervical adenopathy.  Neurological:     General: No focal deficit present.     Mental Status: He is alert and oriented to person, place, and time.  Psychiatric:        Mood and Affect: Mood normal.        Behavior: Behavior normal.        Thought Content: Thought  content normal.        Judgment: Judgment normal.     UC Treatments / Results  Labs (all labs ordered are listed, but only abnormal results are displayed) Labs Reviewed - No data to display  EKG   Radiology No results found.  Procedures Procedures (including critical care time)  Medications Ordered in UC Medications - No data to display  Initial Impression / Assessment and Plan / UC Course  I have reviewed the triage vital signs and the nursing notes.  Pertinent labs & imaging results that were available during my care of the patient were reviewed by me and considered in my medical decision making (see chart for details).     This patient is a very pleasant 26 y.o. year old male presenting with bilateral AOM x3 weeks following viral syndrome. Afebrile but mildly tachy.  Amoxicillin as below.   ED return precautions discussed. Patient verbalizes understanding and agreement.   Coding Level 4 for acute illness with systemic symptoms, and prescription drug management   Final Clinical Impressions(s) / UC Diagnoses   Final diagnoses:  Non-recurrent acute suppurative otitis media of both ears without spontaneous rupture of tympanic membranes     Discharge Instructions      -Amoxicillin twice daily x 7 days     ED Prescriptions     Medication Sig Dispense Auth. Provider   amoxicillin (AMOXIL) 875 MG tablet Take 1 tablet (875 mg total) by mouth 2 (two) times daily for 7 days. 14 tablet Hazel Sams, PA-C      PDMP not reviewed this encounter.   Hazel Sams, PA-C 05/31/21 1302

## 2021-05-31 NOTE — Discharge Instructions (Addendum)
-  Amoxicillin twice daily x7 days ° °
